# Patient Record
Sex: Male | Born: 1940 | Race: White | Hispanic: No | Marital: Married | Smoking: Former smoker
Health system: Southern US, Community
[De-identification: ages and names within clinical notes are randomized; demographics above are authoritative.]

## PROBLEM LIST (undated history)

## (undated) DIAGNOSIS — M1990 Unspecified osteoarthritis, unspecified site: Secondary | ICD-10-CM

## (undated) DIAGNOSIS — M810 Age-related osteoporosis without current pathological fracture: Secondary | ICD-10-CM

## (undated) DIAGNOSIS — C801 Malignant (primary) neoplasm, unspecified: Secondary | ICD-10-CM

## (undated) DIAGNOSIS — K219 Gastro-esophageal reflux disease without esophagitis: Secondary | ICD-10-CM

## (undated) DIAGNOSIS — S3210XA Unspecified fracture of sacrum, initial encounter for closed fracture: Secondary | ICD-10-CM

## (undated) HISTORY — DX: Age-related osteoporosis without current pathological fracture: M81.0

## (undated) HISTORY — PX: COLONOSCOPY: SHX174

## (undated) HISTORY — PX: NECK SURGERY: SHX720

---

## 1995-07-14 HISTORY — PX: BACK SURGERY: SHX140

## 2012-04-06 ENCOUNTER — Ambulatory Visit: Payer: Self-pay | Admitting: Family Medicine

## 2012-04-06 LAB — CREATININE, SERUM: Creatinine: 0.77 mg/dL (ref 0.60–1.30)

## 2012-07-19 ENCOUNTER — Ambulatory Visit: Payer: Self-pay | Admitting: Rheumatology

## 2013-09-04 DIAGNOSIS — H9190 Unspecified hearing loss, unspecified ear: Secondary | ICD-10-CM | POA: Insufficient documentation

## 2013-12-25 DIAGNOSIS — J8410 Pulmonary fibrosis, unspecified: Secondary | ICD-10-CM | POA: Insufficient documentation

## 2013-12-25 DIAGNOSIS — M5136 Other intervertebral disc degeneration, lumbar region: Secondary | ICD-10-CM | POA: Insufficient documentation

## 2016-08-09 ENCOUNTER — Encounter: Payer: Self-pay | Admitting: Emergency Medicine

## 2016-08-09 ENCOUNTER — Emergency Department
Admission: EM | Admit: 2016-08-09 | Discharge: 2016-08-09 | Disposition: A | Discharge status: Home / Self Care | Payer: Medicare Other | Attending: Emergency Medicine | Admitting: Emergency Medicine

## 2016-08-09 DIAGNOSIS — M069 Rheumatoid arthritis, unspecified: Secondary | ICD-10-CM | POA: Insufficient documentation

## 2016-08-09 DIAGNOSIS — M25512 Pain in left shoulder: Secondary | ICD-10-CM | POA: Diagnosis present

## 2016-08-09 DIAGNOSIS — Z79899 Other long term (current) drug therapy: Secondary | ICD-10-CM | POA: Diagnosis not present

## 2016-08-09 DIAGNOSIS — Z87891 Personal history of nicotine dependence: Secondary | ICD-10-CM | POA: Diagnosis not present

## 2016-08-09 DIAGNOSIS — M05712 Rheumatoid arthritis with rheumatoid factor of left shoulder without organ or systems involvement: Secondary | ICD-10-CM

## 2016-08-09 HISTORY — DX: Unspecified osteoarthritis, unspecified site: M19.90

## 2016-08-09 MED ORDER — PREDNISONE 10 MG (21) PO TBPK: 10.0000 mg | ORAL_TABLET | Freq: Every day | ORAL | 0 refills | Status: DC

## 2016-08-09 MED ORDER — METHYLPREDNISOLONE SODIUM SUCC 125 MG IJ SOLR
125.0000 mg | Freq: Once | INTRAMUSCULAR | Status: AC
  Administered 2016-08-09: 125 mg via INTRAMUSCULAR
  Filled 2016-08-09: qty 2

## 2016-08-09 NOTE — ED Notes (Signed)
AAOx3.  Skin warm and dry.  NAD 

## 2016-08-09 NOTE — ED Notes (Signed)
First nurse note   States he is having a RA flare  Having increased pain to left shoulder and arm

## 2016-08-09 NOTE — ED Provider Notes (Signed)
[facility] Emergency Department Provider Note  ____________________________________________  Time seen: Approximately 1:05 PM  I have reviewed the triage vital signs and the nursing notes.   HISTORY  Chief Complaint Shoulder Pain    HPI Dean Davidson is a 76 y.o. male presenting to the emergency department with 10 out of 10 left shoulder pain. Patient has a history of rheumatoid arthritis. Patient states that his symptoms feel similar to his last "RA flare up". Patient states that rheumatoid arthritis flareups are typically treated with an injection of Solu-Medrol by his rheumatologist. Patient states that he is currently unable to seek care with his rheumatology provider. Patient denies fever, history of septic joints,  falls or mechanisms of trauma. Patient is left-handed.   Past Medical History:  Diagnosis Date  . Arthritis     There are no active problems to display for this patient.   Past Surgical History:  Procedure Laterality Date  . BACK SURGERY      Prior to Admission medications   Medication Sig Start Date End Date Taking? Authorizing Provider  methotrexate (RHEUMATREX) 2.5 MG tablet Take 2.5 mg by mouth once a week. Caution:Chemotherapy. Protect from light.   Yes Historical Provider, MD  oxyCODONE (ROXICODONE) 5 MG/5ML solution Take by mouth every 4 (four) hours as needed for severe pain.   Yes Historical Provider, MD  predniSONE (STERAPRED UNI-PAK 21 TAB) 10 MG (21) TBPK tablet Take 1 tablet (10 mg total) by mouth daily. Take 6 tablets the first day, take 5 tablets the second day, take 4 tablets the third day, take 3 tablets the fourth day, take 2 tablets the fifth day and 1 tablet the sixth day. 08/09/16   Lannie Fields, PA-C    Allergies Patient has no known allergies.  History reviewed. No pertinent family history.  Social History Social History  Substance Use Topics  . Smoking status: Former Smoker    Types: Cigarettes   Quit date: 07/13/2012  . Smokeless tobacco: Never Used  . Alcohol use Not on file     Review of Systems  Constitutional: No fever/chills Eyes: No visual changes. No discharge ENT: No upper respiratory complaints. Cardiovascular: no chest pain. Respiratory: no cough. No SOB. Gastrointestinal: No abdominal pain.  No nausea, no vomiting.  No diarrhea.  No constipation. Musculoskeletal: Patient has left shoulder pain.  Skin: Negative for rash, abrasions, lacerations, ecchymosis. Neurological: Negative for headaches, focal weakness or numbness. ____________________________________________   PHYSICAL EXAM:  VITAL SIGNS: ED Triage Vitals  Enc Vitals Group     BP --      Pulse Rate 08/09/16 1036 (!) 103     Resp 08/09/16 1036 20     Temp 08/09/16 1036 98.6 F (37 C)     Temp Source 08/09/16 1036 Oral     SpO2 08/09/16 1036 94 %     Weight 08/09/16 1037 186 lb (84.4 kg)     Height 08/09/16 1037 '5\' 11"'$  (1.803 m)     Head Circumference --      Peak Flow --      Pain Score 08/09/16 1037 10     Pain Loc --      Pain Edu? --      Excl. in [place]? --     Constitutional: Alert and oriented. Patient is talkative and engaged.  Eyes: Palpebral and bulbar conjunctiva are nonerythematous bilaterally. PERRL. EOMI. No scleral icterus bilaterally. Head: Atraumatic. ENT:      Ears: Tympanic membranes are pearly  bilaterally without effusion, erythema or purulent exudate. Bony landmarks are visualized bilaterally.       Nose: Skin overlying nares is without erythema. Nasal turbinates are non-erythematous. Nasal septum is midline.      Mouth/Throat: Mucous membranes are moist. Posterior pharynx is nonerythematous. No tonsillar exudate, hypertrophy or petechiae visualized. Uvula is midline. Neck: Full range of motion. No pain with neck flexion. Hematological/Lymphatic/Immunilogical: No cervical lymphadenopathy.  Cardiovascular: No pain with palpation over the anterior and posterior chest wall. Normal  rate, regular rhythm. Normal S1 and S2. No murmurs, gallops or rubs auscultated.  Respiratory: Trachea is midline. No retractions or presence of deformity. Thoracic expansion is symmetric with unaccentuated tactile fremitus. Resonant and symmetric percussion tones bilaterally. On auscultation, adventitious sounds are absent.  Musculoskeletal:  To inspection, patient is holding left upper extremity with right hand. Patient is able to perform adequate grip strength. Patient can perform limited flexion and extension at the left wrist. However, strength assessment was largely compromised due to patient's pain. Patient has no tenderness to palpation over the left acromioclavicular joint, left deltoid and left supraspinatus. Palpable radial and ulnar pulses bilaterally and symmetrically. Neurologic:  Normal for age. No gross focal neurologic deficits are appreciated.  Reflexes are 2+ and symmetric in the lower extremities bilaterally. Skin: Skin overlying left shoulder is not erythematous without evidence of bruising. Psychiatric: Mood and affect are normal for age. Speech and behavior are normal.  ____________________________________________   LABS (all labs ordered are listed, but only abnormal results are displayed)  Labs Reviewed - No data to display ____________________________________________  EKG   ____________________________________________  RADIOLOGY   No results found.  ____________________________________________    PROCEDURES  Procedure(s) performed:    Procedures    Medications  methylPREDNISolone sodium succinate (SOLU-MEDROL) 125 mg/2 mL injection 125 mg (125 mg Intramuscular Given 08/09/16 1316)     ____________________________________________   INITIAL IMPRESSION / ASSESSMENT AND PLAN / ED COURSE  Pertinent labs & imaging results that were available during my care of the patient were reviewed by me and considered in my medical decision making (see chart for  details).  Review of the [place] CSRS was performed in accordance of the [place] prior to dispensing any controlled drugs.    Assessment and Plan Rheumatoid Arthritis  Patient presents to the emergency department with exacerbation of rheumatoid arthritis. Patient recalls no falls or mechanisms of trauma that would warrant DG left shoulder. Patient was given Solu-Medrol in the emergency department and discharged with taper prednisone. Patient was advised to seek care with his rheumatology provider as soon as possible. All patient questions were answered.   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 Take 6 tablets the first day, take 5 tablets the second day, take 4 tablets the third day, take 3 tablets the fourth day, take 2 tablets the fifth day and 1 tablet the sixth day., Starting Sun 08/09/2016, Print            This chart was dictated using voice recognition software/Dragon. Despite best efforts to proofread, errors can occur which can change the meaning. Any change was purely unintentional.  Lannie Fields, PA-C 08/09/16 [place], MD 08/10/16 [phone]

## 2016-08-09 NOTE — ED Notes (Signed)
See triage note  Having increased pain to left shoulder and into left arm  Hx of RA and thinks meds are not working

## 2016-08-09 NOTE — ED Triage Notes (Signed)
Pt has RA; he has been feeling pain in his shoulder x 4 days, worsening last night. Pt has taken all his medications with no relief. Pt moaning and calling out during triage.

## 2016-09-10 DIAGNOSIS — S3210XA Unspecified fracture of sacrum, initial encounter for closed fracture: Secondary | ICD-10-CM

## 2016-09-10 HISTORY — DX: Unspecified fracture of sacrum, initial encounter for closed fracture: S32.10XA

## 2016-09-16 ENCOUNTER — Other Ambulatory Visit: Payer: Self-pay | Admitting: Rheumatology

## 2016-09-16 DIAGNOSIS — M545 Low back pain: Secondary | ICD-10-CM

## 2016-09-17 ENCOUNTER — Ambulatory Visit
Admission: RE | Admit: 2016-09-17 | Discharge: 2016-09-17 | Disposition: A | Discharge status: Home / Self Care | Payer: Medicare Other | Source: Ambulatory Visit | Attending: Rheumatology | Admitting: Rheumatology

## 2016-09-17 DIAGNOSIS — M48061 Spinal stenosis, lumbar region without neurogenic claudication: Secondary | ICD-10-CM | POA: Diagnosis not present

## 2016-09-17 DIAGNOSIS — M545 Low back pain: Secondary | ICD-10-CM

## 2016-09-17 DIAGNOSIS — Z9889 Other specified postprocedural states: Secondary | ICD-10-CM | POA: Diagnosis not present

## 2016-09-25 ENCOUNTER — Ambulatory Visit
Admission: RE | Admit: 2016-09-25 | Discharge: 2016-09-25 | Disposition: A | Discharge status: Home / Self Care | Payer: Medicare Other | Source: Ambulatory Visit | Attending: Family Medicine | Admitting: Family Medicine

## 2016-09-25 ENCOUNTER — Other Ambulatory Visit: Payer: Self-pay | Admitting: Family Medicine

## 2016-09-25 DIAGNOSIS — R042 Hemoptysis: Secondary | ICD-10-CM | POA: Insufficient documentation

## 2016-09-25 DIAGNOSIS — R918 Other nonspecific abnormal finding of lung field: Secondary | ICD-10-CM

## 2016-09-25 DIAGNOSIS — J8410 Pulmonary fibrosis, unspecified: Secondary | ICD-10-CM | POA: Diagnosis not present

## 2016-09-25 MED ORDER — IOPAMIDOL (ISOVUE-370) INJECTION 76%
100.0000 mL | Freq: Once | INTRAVENOUS | Status: AC | PRN
  Administered 2016-09-25: 75 mL via INTRAVENOUS

## 2016-09-27 ENCOUNTER — Emergency Department
Admission: EM | Admit: 2016-09-27 | Discharge: 2016-09-27 | Disposition: A | Discharge status: Home / Self Care | Payer: Medicare Other | Attending: Emergency Medicine | Admitting: Emergency Medicine

## 2016-09-27 ENCOUNTER — Encounter: Payer: Self-pay | Admitting: Emergency Medicine

## 2016-09-27 DIAGNOSIS — Z87891 Personal history of nicotine dependence: Secondary | ICD-10-CM | POA: Diagnosis not present

## 2016-09-27 DIAGNOSIS — R918 Other nonspecific abnormal finding of lung field: Secondary | ICD-10-CM | POA: Diagnosis not present

## 2016-09-27 DIAGNOSIS — R042 Hemoptysis: Secondary | ICD-10-CM | POA: Diagnosis present

## 2016-09-27 LAB — COMPREHENSIVE METABOLIC PANEL
ALT: 16 U/L — AB (ref 17–63)
AST: 21 U/L (ref 15–41)
Albumin: 4.1 g/dL (ref 3.5–5.0)
Alkaline Phosphatase: 142 U/L — ABNORMAL HIGH (ref 38–126)
Anion gap: 6 (ref 5–15)
BUN: 9 mg/dL (ref 6–20)
CHLORIDE: 97 mmol/L — AB (ref 101–111)
CO2: 30 mmol/L (ref 22–32)
CREATININE: 0.68 mg/dL (ref 0.61–1.24)
Calcium: 9.2 mg/dL (ref 8.9–10.3)
GFR calc Af Amer: >60 mL/min (ref >60)
GLUCOSE: 90 mg/dL (ref 65–99)
Potassium: 4.4 mmol/L (ref 3.5–5.1)
Sodium: 133 mmol/L — ABNORMAL LOW (ref 135–145)
Total Bilirubin: 0.8 mg/dL (ref 0.3–1.2)
Total Protein: 7.2 g/dL (ref 6.5–8.1)

## 2016-09-27 LAB — CBC
HCT: 39.1 % — ABNORMAL LOW (ref 40.0–52.0)
Hemoglobin: 13.3 g/dL (ref 13.0–18.0)
MCH: 34.1 pg — AB (ref 26.0–34.0)
MCHC: 33.9 g/dL (ref 32.0–36.0)
MCV: 100.5 fL — AB (ref 80.0–100.0)
PLATELETS: 335 10*3/uL (ref 150–440)
RBC: 3.89 MIL/uL — ABNORMAL LOW (ref 4.40–5.90)
RDW: 14.4 % (ref 11.5–14.5)
WBC: 10.9 10*3/uL — ABNORMAL HIGH (ref 3.8–10.6)

## 2016-09-27 NOTE — ED Notes (Signed)
BLUE AND RED TUBE IN LAB

## 2016-09-27 NOTE — ED Triage Notes (Addendum)
Pt states he has been coughing up blood clots off and on for the past week, pt states the clots are dark red. Pt seen by PCP for the same on Friday. Has left lung mass, but unsure if it malignant.  Pt states for the past 2 weeks he has had dysphagia as well.  PCP referred for bronch which has not happened. Pt seen at [facility]. Pt states he was recently on Fosamax which he states can cause bleeding esophagus. Denies any recent intubation. Denies any SOB. Pt's wife states his voice has become more hoarse over the past week as well. PT HAD CT SCAN W/ CONTRAST ON 3/16

## 2016-09-27 NOTE — ED Notes (Signed)

## 2016-09-27 NOTE — ED Provider Notes (Signed)
[facility] Emergency Department Provider Note  ____________________________________________   First MD Initiated Contact with Patient 09/27/16 1147     (approximate)  I have reviewed the triage vital signs and the nursing notes.   HISTORY  Chief Complaint Hemoptysis   HPI Dean Davidson is a 76 y.o. male with a recent diagnosis of a lung mass who is presenting with hemoptysis. He has had 3 episodes of hemoptysis each where he coughed up about a quarter or slightly larger sized clot. 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 The patient [email or web address] blood in several days but this morning coughed up another quarter size clot and presented to the emergency department because his primary care provider had told him to come to the hospital for any subsequent hemoptysis. He says that his voice is also slightly hoarse but denies any pain or fever at this time. There is primary care doctor he is in the process of scheduling bronchoscopy as well as oncology follow-up.   Past Medical History:  Diagnosis Date  . Arthritis     There are no active problems to display for this patient.   Past Surgical History:  Procedure Laterality Date  . BACK SURGERY    . NECK SURGERY      Prior to Admission medications   Medication Sig Start Date End Date Taking? Authorizing Provider  methotrexate (RHEUMATREX) 2.5 MG tablet Take 2.5 mg by mouth once a week. Caution:Chemotherapy. Protect from light.    Historical Provider, MD  oxyCODONE (ROXICODONE) 5 MG/5ML solution Take by mouth every 4 (four) hours as needed for severe pain.    Historical Provider, MD  predniSONE (STERAPRED UNI-PAK 21 TAB) 10 MG (21) TBPK tablet Take 1 tablet (10 mg total) by mouth daily. Take 6 tablets the first day, take 5 tablets the  second day, take 4 tablets the third day, take 3 tablets the fourth day, take 2 tablets the fifth day and 1 tablet the sixth day. 08/09/16   Lannie Fields, PA-C    Allergies Patient has no known allergies.  History reviewed. No pertinent family history.  Social History Social History  Substance Use Topics  . Smoking status: Former Smoker    Types: Cigarettes    Quit date: 07/13/2012  . Smokeless tobacco: Never Used  . Alcohol use Not on file    Review of Systems Constitutional: No fever/chills Eyes: No visual changes. ENT: No sore throat. Cardiovascular: Denies chest pain. Respiratory: Denies shortness of breath. Gastrointestinal: No abdominal pain.  No nausea, no vomiting.  No diarrhea.  No constipation. Genitourinary: Negative for dysuria. Musculoskeletal: Negative for back pain. Skin: Negative for rash. Neurological: Negative for headaches, focal weakness or numbness.  10-point ROS otherwise negative.  ____________________________________________   PHYSICAL EXAM:  VITAL SIGNS: ED Triage Vitals  Enc Vitals Group     BP 09/27/16 1029 (!) 155/86     Pulse Rate 09/27/16 1029 84     Resp 09/27/16 1029 18     Temp 09/27/16 1029 98.9 F (37.2 C)     Temp Source 09/27/16 1029 Oral     SpO2 09/27/16 1029 95 %     Weight 09/27/16 1030 180 lb (81.6 kg)     Height 09/27/16 1030 '5\' 10"'$  (1.778 m)  Head Circumference --      Peak Flow --      Pain Score 09/27/16 1040 2     Pain Loc --      Pain Edu? --      Excl. in [place]? --     Constitutional: Alert and oriented. Well appearing and in no acute distress. Eyes: Conjunctivae are normal. PERRL. EOMI. Head: Atraumatic. Nose: No congestion/rhinnorhea. Mouth/Throat: Mucous membranes are moist.  Oropharynx non-erythematous. Neck: No stridor.   Cardiovascular: Normal rate, regular rhythm. Grossly normal heart sounds.   Respiratory: Normal respiratory effort.  No retractions. Lungs CTAB. Gastrointestinal: Soft and  nontender. No distention.  Musculoskeletal: No lower extremity tenderness nor edema.  No joint effusions. Neurologic:  Normal speech and language. No gross focal neurologic deficits are appreciated.  Skin:  Skin is warm, dry and intact. No rash noted. Psychiatric: Mood and affect are normal. Speech and behavior are normal.  ____________________________________________   LABS (all labs ordered are listed, but only abnormal results are displayed)  Labs Reviewed  COMPREHENSIVE METABOLIC PANEL - Abnormal; Notable for the following:       Result Value   Sodium 133 (*)    Chloride 97 (*)    ALT 16 (*)    Alkaline Phosphatase 142 (*)    All other components within normal limits  CBC - Abnormal; Notable for the following:    WBC 10.9 (*)    RBC 3.89 (*)    HCT 39.1 (*)    MCV 100.5 (*)    MCH 34.1 (*)    All other components within normal limits   ____________________________________________  EKG   ____________________________________________  RADIOLOGY  CT CHEST W CONTRAST (Accession [id number]) (Order [id number])  Imaging  Date: 09/25/2016 Department: [facility] CT Released By: Hulen Luster Authorizing: Hortencia Pilar, MD  Exam Information   Status Exam Begun  Exam Ended   Final [99] 09/25/2016 12:01 PM 09/25/2016 12:14 PM  PACS Images   Show images for CT CHEST W CONTRAST  Study Result   CLINICAL DATA:  Pt with lung mass and coughing up blood.  EXAM: CT CHEST WITH CONTRAST  TECHNIQUE: Multidetector CT imaging of the chest was performed during intravenous contrast administration.  CONTRAST:  75 mL of Isovue 370 intravenous contrast.  COMPARISON:  Chest CT, 07/19/2012 and 04/06/2012.  FINDINGS: Cardiovascular: The heart is normal in size and configuration. There are dense three-vessel coronary artery calcifications. The great vessels are normal in caliber. Mild atherosclerotic calcifications are noted along the thoracic aorta and aortic  arch branch vessels.  Mediastinum/Nodes: No neck base or axillary masses or adenopathy. There are prominent mediastinal lymph nodes. A prevascular node near the AP window measures 1 cm short axis. A precarinal node measures 12 mm in short axis. No mediastinal or hilar masses. No enlarged hilar lymph nodes. The trachea is widely patent. The esophagus is unremarkable.  Lungs/Pleura: There is a spiculated left upper lobe mass, new since the prior chest CT, measuring 3.8 x 2.5 x 4.4 cm. This has a broad base against the anterior pleural margin.  There are no other lung masses.  There are no suspicious nodules.  There are heterogeneous areas of coarse interstitial thickening with associated bronchiectasis and architectural distortion. This has a lower lung predominance, particularly in the lower lobes and left upper lobe lingula. It is also seen adjacent to the left upper lobe mass. Much of this appears to be honeycombing. Findings are consistent with usual interstitial fibrosis, which  has significantly advanced when compared to the prior chest radiograph. There are underlying changes of mild centrilobular emphysema.  No evidence of pneumonia or pulmonary edema. No pleural effusion or pneumothorax.  Upper Abdomen: There is a low-density lesion in the medial segment of the left liver lobe measuring 1 cm, similar to the prior study consistent with a cyst. No other liver lesions on the included field of view. No adrenal masses. No acute findings in the visualized upper abdomen.  Musculoskeletal: No fracture or acute finding. No osteoblastic or osteolytic lesions.  IMPRESSION: 1. 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 No convincing metastatic disease in the chest. There are prominent mediastinal lymph nodes, which are stable when compared to the prior CTs, likely reactive. There is no convincing metastatic hilar or mediastinal  adenopathy. There are no other lung masses or nodules to suggest metastatic disease to the lungs. 2. 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 3. No pneumonia, pulmonary edema or acute abnormality.   Electronically Signed   By: Lajean Manes M.D.   On: 09/25/2016 12:29     ____________________________________________   PROCEDURES  Procedure(s) performed:   Procedures  Critical Care performed:   ____________________________________________   INITIAL IMPRESSION / ASSESSMENT AND PLAN / ED COURSE  Pertinent labs & imaging results that were available during my care of the patient were reviewed by me and considered in my medical decision making (see chart for details).  ----------------------------------------- 12:25 PM on 09/27/2016 -----------------------------------------  I discussed the case with Dr. Humphrey Rolls of pulmonology who agrees that the amount of hemoptysis is minimal and very reassuring that the patient has a normal hemoglobin. I discussed return precautions with the patient including any worsening hemoptysis and that he should return to the hospital immediately for any worsening or concerning symptoms. We discussed the CAT scan results including the mass in the pulmonary fibrosis. The patient says that he is a former smoker but has stopped as of several years ago. He'll be following up with his primary care doctor to urgently schedule these follow-up appointments including a bronchoscopy oncology follow-up. He'll be discharged home.      ____________________________________________   FINAL CLINICAL IMPRESSION(S) / ED DIAGNOSES  Hemoptysis. Lung mass.    NEW MEDICATIONS STARTED DURING THIS VISIT:  New Prescriptions   No medications on file     Note:  This document was prepared using Dragon voice recognition software and may include unintentional dictation errors.      Orbie Pyo, MD 09/27/16 1226

## 2016-09-28 ENCOUNTER — Other Ambulatory Visit: Payer: Self-pay | Admitting: Family Medicine

## 2016-09-28 DIAGNOSIS — R1319 Other dysphagia: Secondary | ICD-10-CM

## 2016-09-28 DIAGNOSIS — R1310 Dysphagia, unspecified: Secondary | ICD-10-CM

## 2016-10-01 DIAGNOSIS — C3492 Malignant neoplasm of unspecified part of left bronchus or lung: Secondary | ICD-10-CM | POA: Insufficient documentation

## 2016-10-01 NOTE — Progress Notes (Signed)
[place]  [phone] [phone]) [phone]  ID: Stephani Police OB: February 01, 1941  MR#: [id number]  QMG#:[id number]  Patient Care Team: Hortencia Pilar, MD as PCP - General (Family Medicine)  CHIEF COMPLAINT: Mass of the upper lobe of the left lung.  INTERVAL HISTORY: Patient is a 76 year old male who noticed difficulty swallowing, solids worse than liquids, approximately 2 and half weeks ago. Last week he started coughing up blood which prompted a chest x-ray as well as CT scan. He also reports a more raspy voice over the past several days. He otherwise feels well. He has no neurologic complaints. He has a fair appetite, but denies weight loss. He denies any chest pain or shortness of breath. He denies any nausea, vomiting, constipation, or diarrhea. He has no urinary complaints. Patient otherwise feels well and offers no further specific complaints.  REVIEW OF SYSTEMS:   Review of Systems  Constitutional: Negative.  Negative for fever, malaise/fatigue and weight loss.  HENT:       Difficulty swallowing  Respiratory: Positive for hemoptysis. Negative for cough and shortness of breath.   Cardiovascular: Negative.  Negative for chest pain and leg swelling.  Gastrointestinal: Negative.  Negative for abdominal pain, blood in stool and melena.  Genitourinary: Negative.   Musculoskeletal: Negative.   Neurological: Negative.  Negative for weakness.  Psychiatric/Behavioral: Negative.  The patient is not nervous/anxious.     As per HPI. Otherwise, a complete review of systems is negative.  PAST MEDICAL HISTORY: Past Medical History:  Diagnosis Date  . Arthritis   . Osteoporosis     PAST SURGICAL HISTORY: Past Surgical History:  Procedure Laterality Date  . BACK SURGERY    . NECK SURGERY      FAMILY HISTORY: History reviewed. No pertinent family history.  ADVANCED DIRECTIVES (Y/N):  N  HEALTH MAINTENANCE: Social History  Substance Use Topics  .  Smoking status: Former Smoker    Types: Cigarettes    Quit date: 07/13/2012  . Smokeless tobacco: Never Used  . Alcohol use Not on file     Colonoscopy:  PAP:  Bone density:  Lipid panel:  Allergies  Allergen Reactions  . Silver Rash    Current Outpatient Prescriptions  Medication Sig Dispense Refill  . amitriptyline (ELAVIL) 50 MG tablet Take 50 mg by mouth at bedtime.    . cyclobenzaprine (FLEXERIL) 10 MG tablet Take 10 mg by mouth 2 (two) times daily.    . folic acid (FOLVITE) 1 MG tablet Take 1 mg by mouth daily.    . furosemide (LASIX) 20 MG tablet Take 20 mg by mouth daily.    . hydroxychloroquine (PLAQUENIL) 200 MG tablet Take 400 mg by mouth daily.    . methotrexate 50 MG/2ML injection Inject 25 mg into the vein once a week.    Marland Kitchen oxyCODONE (ROXICODONE) 15 MG immediate release tablet Take 15 mg by mouth every 4 (four) hours as needed for pain.    . predniSONE (DELTASONE) 5 MG tablet Take 5 mg by mouth 2 (two) times daily with a meal.    . sulfaSALAzine (AZULFIDINE) 500 MG tablet Take 500 mg by mouth 2 (two) times daily.      No current facility-administered medications for this visit.     OBJECTIVE: Vitals:   10/02/16 1455  BP: (!) 157/85  Pulse: 86  Resp: 18  Temp: 97.4 F (36.3 C)     Body mass index is 25.78 kg/m.    ECOG FS:1 - Symptomatic but  completely ambulatory  General: Well-developed, well-nourished, no acute distress. Eyes: Pink conjunctiva, anicteric sclera. HEENT: Normocephalic, moist mucous membranes, clear oropharnyx. Lungs: Clear to auscultation bilaterally. Heart: Regular rate and rhythm. No rubs, murmurs, or gallops. Abdomen: Soft, nontender, nondistended. No organomegaly noted, normoactive bowel sounds. Musculoskeletal: No edema, cyanosis, or clubbing. Neuro: Alert, answering all questions appropriately. Cranial nerves grossly intact. Skin: No rashes or petechiae noted. Psych: Normal affect. Lymphatics: No cervical, calvicular, axillary or  inguinal LAD.   LAB RESULTS:  Lab Results  Component Value Date   NA 133 (L) 09/27/2016   K 4.4 09/27/2016   CL 97 (L) 09/27/2016   CO2 30 09/27/2016   GLUCOSE 90 09/27/2016   BUN 9 09/27/2016   CREATININE 0.68 09/27/2016   CALCIUM 9.2 09/27/2016   PROT 7.2 09/27/2016   ALBUMIN 4.1 09/27/2016   AST 21 09/27/2016   ALT 16 (L) 09/27/2016   ALKPHOS 142 (H) 09/27/2016   BILITOT 0.8 09/27/2016   GFRNONAA >60 09/27/2016   GFRAA >60 09/27/2016    Lab Results  Component Value Date   WBC 10.9 (H) 09/27/2016   HGB 13.3 09/27/2016   HCT 39.1 (L) 09/27/2016   MCV 100.5 (H) 09/27/2016   PLT 335 09/27/2016     STUDIES: Ct Chest W Contrast  Result Date: 09/25/2016 CLINICAL DATA:  Pt with lung mass and coughing up blood. EXAM: CT CHEST WITH CONTRAST TECHNIQUE: Multidetector CT imaging of the chest was performed during intravenous contrast administration. CONTRAST:  75 mL of Isovue 370 intravenous contrast. COMPARISON:  Chest CT, 07/19/2012 and 04/06/2012. FINDINGS: Cardiovascular: The heart is normal in size and configuration. There are dense three-vessel coronary artery calcifications. The great vessels are normal in caliber. Mild atherosclerotic calcifications are noted along the thoracic aorta and aortic arch branch vessels. Mediastinum/Nodes: No neck base or axillary masses or adenopathy. There are prominent mediastinal lymph nodes. A prevascular node near the AP window measures 1 cm short axis. A precarinal node measures 12 mm in short axis. No mediastinal or hilar masses. No enlarged hilar lymph nodes. The trachea is widely patent. The esophagus is unremarkable. Lungs/Pleura: There is a spiculated left upper lobe mass, new since the prior chest CT, measuring 3.8 x 2.5 x 4.4 cm. This has a broad base against the anterior pleural margin. There are no other lung masses.  There are no suspicious nodules. There are heterogeneous areas of coarse interstitial thickening with associated  bronchiectasis and architectural distortion. This has a lower lung predominance, particularly in the lower lobes and left upper lobe lingula. It is also seen adjacent to the left upper lobe mass. Much of this appears to be honeycombing. Findings are consistent with usual interstitial fibrosis, which has significantly advanced when compared to the prior chest radiograph. There are underlying changes of mild centrilobular emphysema. No evidence of pneumonia or pulmonary edema. No pleural effusion or pneumothorax. Upper Abdomen: There is a low-density lesion in the medial segment of the left liver lobe measuring 1 cm, similar to the prior study consistent with a cyst. No other liver lesions on the included field of view. No adrenal masses. No acute findings in the visualized upper abdomen. Musculoskeletal: No fracture or acute finding. No osteoblastic or osteolytic lesions. IMPRESSION: 1. 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 No convincing metastatic disease in the chest. There are prominent mediastinal lymph nodes, which are stable when compared to the prior CTs, likely reactive. There is no convincing metastatic hilar or  mediastinal adenopathy. There are no other lung masses or nodules to suggest metastatic disease to the lungs. 2. 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 3. No pneumonia, pulmonary edema or acute abnormality. Electronically Signed   By: Lajean Manes M.D.   On: 09/25/2016 12:29   Mr Lumbar Spine Wo Contrast  Result Date: 09/17/2016 CLINICAL DATA:  With lumbar back pain for 1 month with no known injury. Pain radiating to the right leg and foot. Initial encounter. EXAM: MRI LUMBAR SPINE WITHOUT CONTRAST TECHNIQUE: Multiplanar, multisequence MR imaging of the lumbar spine was performed. No intravenous contrast was administered. COMPARISON:  [facility] lumbar  radiographs 09/14/2016 report (no images available). FINDINGS: Segmentation: Lumbar segmentation appears to be normal and will be designated as such for this report. Alignment: Mild straightening of lumbar lordosis. Mild retrolisthesis at both L1-L2 and L5-S1. Vertebrae: Degenerative endplate marrow changes are noted in the lumbar spine especially at L4-L5 and L5-S1 where previous surgical changes are noted. There is very low level edema in the posterior L4 and L5 vertebrae which is felt to be degenerative in nature. However, there is severe marrow edema throughout much of the visible sacrum (series 6, image 36 and series 4, image 10). This appears to be left greater than right with confluent sacral ala involvement. Confluent central S2 and S3 involvement is partially visible. There is mild associated presacral stranding or trace presacral fluid. The SI joints do not appear eroded, in the visible medial iliac bones appear spared. Conus medullaris: Extends to the L1 level and appears normal. Paraspinal and other soft tissues: Negative visualized abdominal viscera. Postoperative changes to the lower lumbar paraspinal soft tissues described below. Disc levels: L1-L2: Severe disc space loss with circumferential disc osteophyte complex and broad-based posterior component. Mild facet and ligament flavum hypertrophy. Mild spinal stenosis. Moderate to severe left and mild right L1 foraminal stenosis. L2-L3: Circumferential disc bulge. Mild facet and ligament flavum hypertrophy. No significant stenosis. L3-L4: Posterior disc space loss. Circumferential disc osteophyte complex. Postoperative changes to the lamina with up to moderate residual facet hypertrophy greater on the left. Mild bilateral L3 foraminal stenosis. L4-L5: Severe disc space loss with bulky left eccentric circumferential disc osteophyte complex. Postoperative changes to the lamina. Moderate residual facet hypertrophy greater on the left. No spinal stenosis  but there is moderate to severe left lateral recess stenosis (descending left L5 nerve root level) and mild bilateral L4 foraminal stenosis. L5-S1: Severe disc space loss. Bulky circumferential disc osteophyte complex with broad-based posterior component. Moderate facet hypertrophy greater on the right. Trace right facet joint fluid. Mild spinal and bilateral L5 foraminal stenosis. IMPRESSION: 1. Severe edema in the visible sacrum. Although not completely visualized I favor this reflects left worse than right acute sacral insufficiency fractures. Pelvis CT or MRI should confirm if the diagnosis is in doubt. 2. Widespread lumbar spine degeneration with postoperative changes to the posterior elements L3-L4 through L5-S1. Moderate to severe residual left lateral recess stenosis at L4-L5. Mild spinal stenosis and moderate to severe left foraminal stenosis at L1-L2. Electronically Signed   By: Genevie Ann M.D.   On: 09/17/2016 16:13    ASSESSMENT: Mass of the upper lobe of the left lung.  PLAN:  1. Mass of the upper lobe of the left lung:  CT scan results reviewed independently and reported as above. I suspect patient's difficulty swallowing, patient changes, and hemoptysis are all related to underlying lung  mass this is highly suspicious for malignancy. Have ordered a PET scan as well as CT-guided biopsy for further evaluation. Patient will return to clinic in 1 week to discuss the results and treatment planning if necessary. 2. Hypertension: Patient's blood pressure is elevated today, monitor.  Approximately 45 minutes was spent in discussion of which greater than 50% was consultation.  Patient expressed understanding and was in agreement with this plan. He also understands that He can call clinic at any time with any questions, concerns, or complaints.   Cancer Staging No matching staging information was found for the patient.  Lloyd Huger, MD   10/02/2016 3:43 PM

## 2016-10-02 ENCOUNTER — Encounter: Payer: Self-pay | Admitting: Oncology

## 2016-10-02 ENCOUNTER — Inpatient Hospital Stay: Payer: Medicare Other | Attending: Oncology | Admitting: Oncology

## 2016-10-02 DIAGNOSIS — Z7952 Long term (current) use of systemic steroids: Secondary | ICD-10-CM | POA: Diagnosis not present

## 2016-10-02 DIAGNOSIS — R490 Dysphonia: Secondary | ICD-10-CM | POA: Diagnosis not present

## 2016-10-02 DIAGNOSIS — R1310 Dysphagia, unspecified: Secondary | ICD-10-CM

## 2016-10-02 DIAGNOSIS — Z87891 Personal history of nicotine dependence: Secondary | ICD-10-CM | POA: Diagnosis not present

## 2016-10-02 DIAGNOSIS — Z79899 Other long term (current) drug therapy: Secondary | ICD-10-CM

## 2016-10-02 DIAGNOSIS — M818 Other osteoporosis without current pathological fracture: Secondary | ICD-10-CM

## 2016-10-02 DIAGNOSIS — R590 Localized enlarged lymph nodes: Secondary | ICD-10-CM

## 2016-10-02 DIAGNOSIS — I10 Essential (primary) hypertension: Secondary | ICD-10-CM

## 2016-10-02 DIAGNOSIS — I2510 Atherosclerotic heart disease of native coronary artery without angina pectoris: Secondary | ICD-10-CM | POA: Diagnosis not present

## 2016-10-02 DIAGNOSIS — M48061 Spinal stenosis, lumbar region without neurogenic claudication: Secondary | ICD-10-CM | POA: Diagnosis not present

## 2016-10-02 DIAGNOSIS — R042 Hemoptysis: Secondary | ICD-10-CM

## 2016-10-02 DIAGNOSIS — M2578 Osteophyte, vertebrae: Secondary | ICD-10-CM | POA: Diagnosis not present

## 2016-10-02 DIAGNOSIS — M129 Arthropathy, unspecified: Secondary | ICD-10-CM | POA: Diagnosis not present

## 2016-10-02 DIAGNOSIS — R918 Other nonspecific abnormal finding of lung field: Secondary | ICD-10-CM

## 2016-10-02 NOTE — Progress Notes (Signed)
New evaluation for lung mass. Complains of hemoptysis every 2 days, lower back pain, unable to swallow solid foods.

## 2016-10-05 ENCOUNTER — Encounter: Payer: Self-pay | Admitting: Rheumatology

## 2016-10-06 ENCOUNTER — Telehealth: Payer: Self-pay | Admitting: *Deleted

## 2016-10-06 ENCOUNTER — Other Ambulatory Visit: Payer: Self-pay | Admitting: *Deleted

## 2016-10-06 DIAGNOSIS — R918 Other nonspecific abnormal finding of lung field: Secondary | ICD-10-CM

## 2016-10-06 NOTE — Telephone Encounter (Signed)
spoke with pt's spouse regarding f/u appt on Friday. Pt currently scheduled for PET scan on 3/28 then biopsy will be scheduled after those results are available. At this time, Dr. Grayland Ormond does not need to follow up with pt on Friday 3/30 and only needs to follow up after biopsy. Pt's spouse has been made aware of this and that appt with Dr. Grayland Ormond on 3/30 will be cancelled. Informed pt's spouse will callback to reschedule follow up once biopsy has been scheduled. Pt's spouse verbalized understanding.

## 2016-10-07 ENCOUNTER — Ambulatory Visit
Admission: RE | Admit: 2016-10-07 | Discharge: 2016-10-07 | Disposition: A | Discharge status: Home / Self Care | Payer: Medicare Other | Source: Ambulatory Visit | Attending: Oncology | Admitting: Oncology

## 2016-10-07 DIAGNOSIS — R918 Other nonspecific abnormal finding of lung field: Secondary | ICD-10-CM | POA: Diagnosis not present

## 2016-10-07 DIAGNOSIS — M8448XD Pathological fracture, other site, subsequent encounter for fracture with routine healing: Secondary | ICD-10-CM | POA: Diagnosis not present

## 2016-10-07 DIAGNOSIS — R933 Abnormal findings on diagnostic imaging of other parts of digestive tract: Secondary | ICD-10-CM | POA: Insufficient documentation

## 2016-10-07 DIAGNOSIS — I2510 Atherosclerotic heart disease of native coronary artery without angina pectoris: Secondary | ICD-10-CM | POA: Diagnosis not present

## 2016-10-07 LAB — GLUCOSE, CAPILLARY: GLUCOSE-CAPILLARY: 100 mg/dL — AB (ref 65–99)

## 2016-10-07 MED ORDER — FLUDEOXYGLUCOSE F - 18 (FDG) INJECTION
12.0000 | Freq: Once | INTRAVENOUS | Status: AC | PRN
  Administered 2016-10-07: 12.94 via INTRAVENOUS

## 2016-10-08 ENCOUNTER — Other Ambulatory Visit: Payer: Self-pay | Admitting: *Deleted

## 2016-10-08 ENCOUNTER — Encounter: Payer: Self-pay | Admitting: *Deleted

## 2016-10-08 DIAGNOSIS — R918 Other nonspecific abnormal finding of lung field: Secondary | ICD-10-CM

## 2016-10-08 NOTE — Progress Notes (Signed)
  Oncology Nurse Navigator Documentation  Navigator Location: [facility] (10/08/16 1500)   )Navigator Encounter Type: Telephone (10/08/16 1500)                             Interventions: Coordination of Care (10/08/16 1500)   Coordination of Care: Appts (10/08/16 1500)       Spoke with pt's wife to inform her of PET results and to inform her of the following appointments: appt with Dr. Kathyrn Sheriff at [facility] in [place], scheduled for Tues 4/3 at 11am; PFT's 4/3 at 0730; CT biopsy 4/4 arrive at 0930 for 1030 procedure time; follow up with Dr. Grayland Ormond in [place] on 4/6 at 1030. Pt's wife wrote all appointments down and verbalized understanding. No further questions at this time.            Time Spent with Patient: 45 (10/08/16 1500)

## 2016-10-09 ENCOUNTER — Ambulatory Visit: Payer: Medicare Other | Admitting: Oncology

## 2016-10-13 ENCOUNTER — Other Ambulatory Visit: Payer: Self-pay | Admitting: Physician Assistant

## 2016-10-14 ENCOUNTER — Ambulatory Visit
Admission: RE | Admit: 2016-10-14 | Discharge: 2016-10-14 | Disposition: A | Discharge status: Home / Self Care | Payer: Medicare Other | Source: Ambulatory Visit | Attending: Oncology | Admitting: Oncology

## 2016-10-14 ENCOUNTER — Ambulatory Visit (HOSPITAL_COMMUNITY): Payer: Medicare Other

## 2016-10-14 ENCOUNTER — Ambulatory Visit: Payer: Medicare Other

## 2016-10-14 ENCOUNTER — Ambulatory Visit
Admission: RE | Admit: 2016-10-14 | Discharge: 2016-10-14 | Disposition: A | Discharge status: Home / Self Care | Payer: Medicare Other | Source: Ambulatory Visit | Attending: Diagnostic Radiology | Admitting: Diagnostic Radiology

## 2016-10-14 DIAGNOSIS — Z9889 Other specified postprocedural states: Secondary | ICD-10-CM | POA: Insufficient documentation

## 2016-10-14 DIAGNOSIS — R918 Other nonspecific abnormal finding of lung field: Secondary | ICD-10-CM

## 2016-10-14 DIAGNOSIS — J479 Bronchiectasis, uncomplicated: Secondary | ICD-10-CM | POA: Diagnosis not present

## 2016-10-14 DIAGNOSIS — C3412 Malignant neoplasm of upper lobe, left bronchus or lung: Secondary | ICD-10-CM | POA: Insufficient documentation

## 2016-10-14 LAB — APTT: APTT: 29 s (ref 24–36)

## 2016-10-14 LAB — CBC WITH DIFFERENTIAL/PLATELET
BASOS ABS: 0.1 10*3/uL (ref 0–0.1)
BASOS PCT: 1 %
EOS ABS: 0.2 10*3/uL (ref 0–0.7)
Eosinophils Relative: 2 %
HCT: 38.6 % — ABNORMAL LOW (ref 40.0–52.0)
HEMOGLOBIN: 12.9 g/dL — AB (ref 13.0–18.0)
Lymphocytes Relative: 20 %
Lymphs Abs: 1.6 10*3/uL (ref 1.0–3.6)
MCH: 33.8 pg (ref 26.0–34.0)
MCHC: 33.5 g/dL (ref 32.0–36.0)
MCV: 101 fL — ABNORMAL HIGH (ref 80.0–100.0)
MONOS PCT: 14 %
Monocytes Absolute: 1.1 10*3/uL — ABNORMAL HIGH (ref 0.2–1.0)
NEUTROS PCT: 63 %
Neutro Abs: 4.9 10*3/uL (ref 1.4–6.5)
Platelets: 306 10*3/uL (ref 150–440)
RBC: 3.82 MIL/uL — ABNORMAL LOW (ref 4.40–5.90)
RDW: 13.7 % (ref 11.5–14.5)
WBC: 7.9 10*3/uL (ref 3.8–10.6)

## 2016-10-14 LAB — PROTIME-INR
INR: 0.98
Prothrombin Time: 13 seconds (ref 11.4–15.2)

## 2016-10-14 MED ORDER — HYDROCODONE-ACETAMINOPHEN 5-325 MG PO TABS: 1.0000 | ORAL_TABLET | ORAL | Status: DC | PRN

## 2016-10-14 MED ORDER — MIDAZOLAM HCL 2 MG/2ML IJ SOLN
INTRAMUSCULAR | Status: DC | PRN
  Administered 2016-10-14: 1 mg via INTRAVENOUS

## 2016-10-14 MED ORDER — FENTANYL CITRATE (PF) 100 MCG/2ML IJ SOLN
INTRAMUSCULAR | Status: DC | PRN
  Administered 2016-10-14: 25 ug via INTRAVENOUS

## 2016-10-14 MED ORDER — SODIUM CHLORIDE 0.9 % IV SOLN
INTRAVENOUS | Status: DC
  Administered 2016-10-14: 20 mL/h via INTRAVENOUS

## 2016-10-14 MED ORDER — ALBUTEROL SULFATE (2.5 MG/3ML) 0.083% IN NEBU
2.5000 mg | INHALATION_SOLUTION | Freq: Once | RESPIRATORY_TRACT | Status: AC
  Administered 2016-10-14: 2.5 mg via RESPIRATORY_TRACT
  Filled 2016-10-14: qty 3

## 2016-10-14 NOTE — Procedures (Signed)
  Pre-operative Diagnosis: Left lung mass        Post-operative Diagnosis: Left lung mass  Indications: Needs tissue diagnosis  Procedure: CT guided biopsy of left upper lobe mass    Findings: Needle directed into left lung mass with CT.  2 cores obtained.  No pneumothorax.  Complications: None     EBL: Minimal  Plan: Bedrest 2 hours.  CXR in one hour.

## 2016-10-14 NOTE — Discharge Instructions (Signed)
Needle Biopsy of the Lung, Care After °This sheet gives you information about how to care for yourself after your procedure. Your health care provider may also give you more specific instructions. If you have problems or questions, contact your health care provider. °What can I expect after the procedure? °After the procedure, it is common to have: °· Soreness, pain, and tenderness where a tissue sample was taken (biopsy site). °· A cough. °· A sore throat. ° °Follow these instructions at home: °Biopsy site care °· Follow instructions from your health care provider about when to remove the bandage that was placed on the biopsy site. °· Keep the bandage dry until it has been removed. °· Check your biopsy site every day for signs of infection. Check for: °? More redness, swelling, or pain. °? More fluid or blood. °? Warmth to the touch. °? Pus or a bad smell. °General instructions °· Rest as directed by your health care provider. Ask your health care provider what activities are safe for you. °· Do not take baths, swim, or use a hot tub until your health care provider approves. °· Take over-the-counter and prescription medicines only as told by your health care provider. °· If you have airplane travel scheduled, talk with your health care provider about when it is safe for you to travel by airplane. °· It is up to you to get the results of your procedure. Ask your health care provider, or the department that is doing the procedure, when your results will be ready. °· Keep all follow-up visits as told by your health care provider. This is important. °Contact a health care provider if: °· You have more redness, swelling, or pain around your biopsy site. °· You have more fluid or blood coming from your biopsy site. °· Your biopsy site feels warm to the touch. °· You have pus or a bad smell coming from your biopsy site. °· You have a fever. °· You have pain that does not get better with medicine. °Get help right away  if: °· You have problems breathing. °· You have chest pain. °· You cough up blood. °· You faint. °· You have a fast heart rate. °Summary °· After a needle biopsy of the lung, it is common to have a cough, a sore throat, or soreness, pain, and tenderness where a tissue sample was taken (biopsy site). °· You should check your biopsy area every day for signs of infection, including pus or a bad smell, warmth, more fluid or blood, or more redness, swelling, or pain. °· You should not take baths, swim, or use a hot tub until your health care provider approves. °· It is up to you to get the results of your procedure. Ask your health care provider, or the department that is doing the procedure, when your results will be ready. °This information is not intended to replace advice given to you by your health care provider. Make sure you discuss any questions you have with your health care provider. °Document Released: 04/26/2007 Document Revised: 05/20/2016 Document Reviewed: 05/20/2016 °[employer] Interactive Patient Education © 2017 [employer] ° ° °

## 2016-10-14 NOTE — H&P (Signed)
Chief Complaint: Patient was seen in consultation today for a left lung mass biopsy at the request of Finnegan,Timothy J  Referring Physician(s): Finnegan,Timothy J  Patient Status: [facility] - Out-pt  History of Present Illness: Dean Davidson is a 76 y.o. male with recent history of hemoptysis.  Imaging discovered a left lung mass. Patient is also complaining of difficulty swallowing. He is not able to eat very well and has recently been losing weight. PET/CT imaging demonstrated an epiglottis lesion along with the left lung mass.  Scheduled for evaluation and biopsy of this epiglottis process.  Patient presents today for CT-guided biopsy of the left lung mass. Long history of smoking and quit 3 years ago.  Most recent hemoptysis was just a few days ago.  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  PMH is significant for Rheumatoid arthritis.   Past Medical History:  Diagnosis Date  . Arthritis   . Osteoporosis     Past Surgical History:  Procedure Laterality Date  . BACK SURGERY    . NECK SURGERY      Allergies: Patient has no active allergies.  Medications: Prior to Admission medications   Medication Sig Start Date End Date Taking? Authorizing Provider  amitriptyline (ELAVIL) 50 MG tablet Take 50 mg by mouth at bedtime.   Yes Historical Provider, MD  cyclobenzaprine (FLEXERIL) 10 MG tablet Take 10 mg by mouth 2 (two) times daily.   Yes Historical Provider, MD  folic acid (FOLVITE) 1 MG tablet Take 1 mg by mouth daily.   Yes Historical Provider, MD  furosemide (LASIX) 20 MG tablet Take 20 mg by mouth daily.   Yes Historical Provider, MD  hydroxychloroquine (PLAQUENIL) 200 MG tablet Take 400 mg by mouth daily.   Yes Historical Provider, MD  methotrexate 50 MG/2ML injection Inject 25 mg into the vein once a week.   Yes Historical Provider, MD  oxyCODONE (ROXICODONE) 15 MG immediate release tablet Take 15 mg by mouth every  4 (four) hours as needed for pain.   Yes Historical Provider, MD  predniSONE (DELTASONE) 5 MG tablet Take 5 mg by mouth 2 (two) times daily with a meal.   Yes Historical Provider, MD  sulfaSALAzine (AZULFIDINE) 500 MG tablet Take 500 mg by mouth 2 (two) times daily.    Yes Historical Provider, MD     No family history on file.  Social History   Social History  . Marital status: Married    Spouse name: N/A  . Number of children: N/A  . Years of education: N/A   Social History Main Topics  . Smoking status: Former Smoker    Types: Cigarettes    Quit date: 07/13/2012  . Smokeless tobacco: Never Used  . Alcohol use Not on file  . Drug use: No  . Sexual activity: Not on file   Other Topics Concern  . Not on file   Social History Narrative  . No narrative on file     Review of Systems  HENT: Positive for trouble swallowing.   Respiratory: Positive for cough. Negative for shortness of breath.   Cardiovascular: Negative for chest pain.  Gastrointestinal: Negative.   Genitourinary: Negative.   Neurological: Negative.     Vital Signs: BP (!) 158/78   Pulse 80   SpO2 95%   Physical Exam  HENT:  Mouth/Throat: Oropharynx is clear and moist.  Cardiovascular: Normal rate, regular rhythm and normal heart sounds.  Pulmonary/Chest: Effort normal. He has wheezes. He has rales.  Abdominal: Soft. Bowel sounds are normal.    Mallampati Score:  MD Evaluation Airway: WNL Heart: WNL Abdomen: WNL Chest/ Lungs: Other (comments) Chest/ lungs comments: Bilateral wheezing ASA  Classification: 2 Mallampati/Airway Score: One  Imaging: Ct Chest W Contrast  Result Date: 09/25/2016 CLINICAL DATA:  Pt with lung mass and coughing up blood. EXAM: CT CHEST WITH CONTRAST TECHNIQUE: Multidetector CT imaging of the chest was performed during intravenous contrast administration. CONTRAST:  75 mL of Isovue 370 intravenous contrast. COMPARISON:  Chest CT, 07/19/2012 and 04/06/2012. FINDINGS:  Cardiovascular: The heart is normal in size and configuration. There are dense three-vessel coronary artery calcifications. The great vessels are normal in caliber. Mild atherosclerotic calcifications are noted along the thoracic aorta and aortic arch branch vessels. Mediastinum/Nodes: No neck base or axillary masses or adenopathy. There are prominent mediastinal lymph nodes. A prevascular node near the AP window measures 1 cm short axis. A precarinal node measures 12 mm in short axis. No mediastinal or hilar masses. No enlarged hilar lymph nodes. The trachea is widely patent. The esophagus is unremarkable. Lungs/Pleura: There is a spiculated left upper lobe mass, new since the prior chest CT, measuring 3.8 x 2.5 x 4.4 cm. This has a broad base against the anterior pleural margin. There are no other lung masses.  There are no suspicious nodules. There are heterogeneous areas of coarse interstitial thickening with associated bronchiectasis and architectural distortion. This has a lower lung predominance, particularly in the lower lobes and left upper lobe lingula. It is also seen adjacent to the left upper lobe mass. Much of this appears to be honeycombing. Findings are consistent with usual interstitial fibrosis, which has significantly advanced when compared to the prior chest radiograph. There are underlying changes of mild centrilobular emphysema. No evidence of pneumonia or pulmonary edema. No pleural effusion or pneumothorax. Upper Abdomen: There is a low-density lesion in the medial segment of the left liver lobe measuring 1 cm, similar to the prior study consistent with a cyst. No other liver lesions on the included field of view. No adrenal masses. No acute findings in the visualized upper abdomen. Musculoskeletal: No fracture or acute finding. No osteoblastic or osteolytic lesions. IMPRESSION: 1. 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 No convincing metastatic  disease in the chest. There are prominent mediastinal lymph nodes, which are stable when compared to the prior CTs, likely reactive. There is no convincing metastatic hilar or mediastinal adenopathy. There are no other lung masses or nodules to suggest metastatic disease to the lungs. 2. 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 3. No pneumonia, pulmonary edema or acute abnormality. Electronically Signed   By: Lajean Manes M.D.   On: 09/25/2016 12:29   Mr Lumbar Spine Wo Contrast  Result Date: 09/17/2016 CLINICAL DATA:  With lumbar back pain for 1 month with no known injury. Pain radiating to the right leg and foot. Initial encounter. EXAM: MRI LUMBAR SPINE WITHOUT CONTRAST TECHNIQUE: Multiplanar, multisequence MR imaging of the lumbar spine was performed. No intravenous contrast was administered. COMPARISON:  [facility] lumbar radiographs 09/14/2016 report (no images available). FINDINGS: Segmentation: Lumbar segmentation appears to be normal and will be designated as such for this report. Alignment: Mild straightening of lumbar lordosis. Mild retrolisthesis at both L1-L2 and L5-S1. Vertebrae: Degenerative endplate marrow changes are noted in the lumbar spine  especially at L4-L5 and L5-S1 where previous surgical changes are noted. There is very low level edema in the posterior L4 and L5 vertebrae which is felt to be degenerative in nature. However, there is severe marrow edema throughout much of the visible sacrum (series 6, image 36 and series 4, image 10). This appears to be left greater than right with confluent sacral ala involvement. Confluent central S2 and S3 involvement is partially visible. There is mild associated presacral stranding or trace presacral fluid. The SI joints do not appear eroded, in the visible medial iliac bones appear spared. Conus medullaris: Extends to the L1 level and appears normal.  Paraspinal and other soft tissues: Negative visualized abdominal viscera. Postoperative changes to the lower lumbar paraspinal soft tissues described below. Disc levels: L1-L2: Severe disc space loss with circumferential disc osteophyte complex and broad-based posterior component. Mild facet and ligament flavum hypertrophy. Mild spinal stenosis. Moderate to severe left and mild right L1 foraminal stenosis. L2-L3: Circumferential disc bulge. Mild facet and ligament flavum hypertrophy. No significant stenosis. L3-L4: Posterior disc space loss. Circumferential disc osteophyte complex. Postoperative changes to the lamina with up to moderate residual facet hypertrophy greater on the left. Mild bilateral L3 foraminal stenosis. L4-L5: Severe disc space loss with bulky left eccentric circumferential disc osteophyte complex. Postoperative changes to the lamina. Moderate residual facet hypertrophy greater on the left. No spinal stenosis but there is moderate to severe left lateral recess stenosis (descending left L5 nerve root level) and mild bilateral L4 foraminal stenosis. L5-S1: Severe disc space loss. Bulky circumferential disc osteophyte complex with broad-based posterior component. Moderate facet hypertrophy greater on the right. Trace right facet joint fluid. Mild spinal and bilateral L5 foraminal stenosis. IMPRESSION: 1. Severe edema in the visible sacrum. Although not completely visualized I favor this reflects left worse than right acute sacral insufficiency fractures. Pelvis CT or MRI should confirm if the diagnosis is in doubt. 2. Widespread lumbar spine degeneration with postoperative changes to the posterior elements L3-L4 through L5-S1. Moderate to severe residual left lateral recess stenosis at L4-L5. Mild spinal stenosis and moderate to severe left foraminal stenosis at L1-L2. Electronically Signed   By: Genevie Ann M.D.   On: 09/17/2016 16:13   Nm Pet Image Initial (pi) Skull Base To Thigh  Result Date:  10/07/2016 CLINICAL DATA:  Initial treatment strategy for left lung mass. EXAM: NUCLEAR MEDICINE PET SKULL BASE TO THIGH TECHNIQUE: 12.9 mCi F-18 FDG was injected intravenously. Full-ring PET imaging was performed from the skull base to thigh after the radiotracer. CT data was obtained and used for attenuation correction and anatomic localization. FASTING BLOOD GLUCOSE:  Value: 100 mg/dl COMPARISON:  CT on 09/25/16 FINDINGS: NECK No hypermetabolic lymph nodes in the neck. Hypermetabolic soft tissue density is seen involving the epiglottis and left aryepiglottic fold. This measures 1.6 x 2.3 cm in maximum diameter, with SUV max of 12.7. Primary hypopharyngeal/supraglottic carcinoma cannot be excluded. CHEST 3.8 cm hypermetabolic mass is seen in the anterior left upper lobe with signs of left anterior chest wall invasion. This mass has SUV max of 17.3, and is consistent with primary bronchogenic carcinoma. Hypermetabolic activity is seen in the left inferior pleural space, corresponding with mild diffuse pleural thickening. This is likely reactive in etiology given evidence of chronic bibasilar pulmonary interstitial fibrosis. No focal pleural mass or pleural effusion identified. An area of FDG uptake is also seen corresponding to ill-defined airspace opacity in the posterior right upper lobe which is new since previous study  and consistent with infectious or inflammatory etiology. No hypermetabolic lymphadenopathy within the thorax . Aortic and coronary artery atherosclerosis. ABDOMEN/PELVIS No abnormal hypermetabolic activity within the liver, pancreas, adrenal glands, or spleen. No hypermetabolic lymph nodes in the abdomen or pelvis. Aortic atherosclerosis noted. SKELETON No focal hypermetabolic activity to suggest skeletal metastasis. Hypermetabolic activity is seen within the sacral ala bilaterally, consistent with healing sacral insufficiency fractures. IMPRESSION: 3.8 cm hypermetabolic mass in anterior left  upper lobe, with left anterior chest wall invasion. This is consistent with primary bronchogenic carcinoma. No evidence of thoracic nodal or distant metastatic disease. 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 New posterior right upper lobe airspace opacity with FDG uptake, consistent with infectious or inflammatory etiology. Hypermetabolic soft tissue density involving the epiglottis and left aryepiglottic fold. Primary hypopharyngeal/supraglottic carcinoma cannot be excluded. Recommend ENT consultation for direct visualization. Healing bilateral sacral insufficiency fractures. Aortic and coronary artery atherosclerosis. Electronically Signed   By: Earle Gell M.D.   On: 10/07/2016 13:21    Labs:  CBC:  Recent Labs  09/27/16 1045 10/14/16 0939  WBC 10.9* 7.9  HGB 13.3 12.9*  HCT 39.1* 38.6*  PLT 335 306    COAGS:  Recent Labs  10/14/16 0939  INR 0.98  APTT 29    BMP:  Recent Labs  09/27/16 1045  NA 133*  K 4.4  CL 97*  CO2 30  GLUCOSE 90  BUN 9  CALCIUM 9.2  CREATININE 0.68  GFRNONAA >60  GFRAA >60    LIVER FUNCTION TESTS:  Recent Labs  09/27/16 1045  BILITOT 0.8  AST 21  ALT 16*  ALKPHOS 142*  PROT 7.2  ALBUMIN 4.1    TUMOR MARKERS: No results for input(s): AFPTM, CEA, CA199, CHROMGRNA in the last 8760 hours.  Assessment and Plan:  76 yo with multiple medical problems including hemoptysis and left lung mass.  Mass is highly suspicious for a lung cancer and probably separate from the lesion in his epiglottis.  Discussed CT guided left lung mass biopsy with patient and wife.  Explained the risks which include pneumothorax, hemoptysis and even death.  I am concerned about hemoptysis since he is already having spontaneous hemoptysis. Patient is aware of reasons for the biopsy and risks.  Informed consent obtained.  Plan for CT guided left lung mass biopsy with moderate  sedation.    Thank you for this interesting consult.  I greatly enjoyed meeting Dean Davidson and look forward to participating in their care.  A copy of this report was sent to the requesting provider on this date.  Electronically Signed: Carylon Perches 10/14/2016, 10:17 AM   I spent a total of  15 Minutes   in face to face in clinical consultation, greater than 50% of which was counseling/coordinating care for a CT guided lung mass biopsy.

## 2016-10-15 LAB — SURGICAL PATHOLOGY

## 2016-10-16 ENCOUNTER — Inpatient Hospital Stay: Payer: Medicare Other | Attending: Oncology | Admitting: Oncology

## 2016-10-16 ENCOUNTER — Encounter
Admission: RE | Admit: 2016-10-16 | Discharge: 2016-10-16 | Disposition: A | Discharge status: Home / Self Care | Payer: Medicare Other | Source: Ambulatory Visit | Attending: Otolaryngology | Admitting: Otolaryngology

## 2016-10-16 ENCOUNTER — Encounter: Payer: Self-pay | Admitting: *Deleted

## 2016-10-16 VITALS — BP 167/78 | HR 89 | Temp 97.2°F | Resp 18 | Wt 178.8 lb

## 2016-10-16 DIAGNOSIS — I2510 Atherosclerotic heart disease of native coronary artery without angina pectoris: Secondary | ICD-10-CM | POA: Insufficient documentation

## 2016-10-16 DIAGNOSIS — R1310 Dysphagia, unspecified: Secondary | ICD-10-CM | POA: Diagnosis not present

## 2016-10-16 DIAGNOSIS — J387 Other diseases of larynx: Secondary | ICD-10-CM | POA: Insufficient documentation

## 2016-10-16 DIAGNOSIS — Z7189 Other specified counseling: Secondary | ICD-10-CM

## 2016-10-16 DIAGNOSIS — C321 Malignant neoplasm of supraglottis: Secondary | ICD-10-CM | POA: Diagnosis not present

## 2016-10-16 DIAGNOSIS — M069 Rheumatoid arthritis, unspecified: Secondary | ICD-10-CM | POA: Diagnosis not present

## 2016-10-16 DIAGNOSIS — C3492 Malignant neoplasm of unspecified part of left bronchus or lung: Secondary | ICD-10-CM

## 2016-10-16 DIAGNOSIS — R590 Localized enlarged lymph nodes: Secondary | ICD-10-CM | POA: Diagnosis not present

## 2016-10-16 DIAGNOSIS — C3411 Malignant neoplasm of upper lobe, right bronchus or lung: Secondary | ICD-10-CM

## 2016-10-16 DIAGNOSIS — Z7952 Long term (current) use of systemic steroids: Secondary | ICD-10-CM | POA: Diagnosis not present

## 2016-10-16 DIAGNOSIS — Z87891 Personal history of nicotine dependence: Secondary | ICD-10-CM | POA: Diagnosis not present

## 2016-10-16 DIAGNOSIS — Z8781 Personal history of (healed) traumatic fracture: Secondary | ICD-10-CM | POA: Insufficient documentation

## 2016-10-16 DIAGNOSIS — Z79899 Other long term (current) drug therapy: Secondary | ICD-10-CM | POA: Diagnosis not present

## 2016-10-16 DIAGNOSIS — M818 Other osteoporosis without current pathological fracture: Secondary | ICD-10-CM | POA: Insufficient documentation

## 2016-10-16 DIAGNOSIS — I10 Essential (primary) hypertension: Secondary | ICD-10-CM | POA: Diagnosis not present

## 2016-10-16 DIAGNOSIS — K219 Gastro-esophageal reflux disease without esophagitis: Secondary | ICD-10-CM

## 2016-10-16 HISTORY — DX: Unspecified fracture of sacrum, initial encounter for closed fracture: S32.10XA

## 2016-10-16 HISTORY — DX: Malignant (primary) neoplasm, unspecified: C80.1

## 2016-10-16 HISTORY — DX: Gastro-esophageal reflux disease without esophagitis: K21.9

## 2016-10-16 NOTE — Progress Notes (Signed)
Patient is here with his wife. He has no complaints today.

## 2016-10-16 NOTE — Progress Notes (Signed)
  Oncology Nurse Navigator Documentation  Navigator Location: [facility] (10/16/16 1300)   )Navigator Encounter Type: Follow-up Appt (10/16/16 1300)                         Barriers/Navigation Needs: No barriers at this time;No Questions (10/16/16 1300)   Interventions: Referrals;Coordination of Care (10/16/16 1300) Referrals: Other (cardiothoracic surgery) (10/16/16 1300) Coordination of Care: Appts (10/16/16 1300)         met with patient during follow up visit with Dr. Grayland Ormond to discuss biopsy results and treatment planning. All questions were answered during visit. Referral to Dr. Genevive Bi for evaluation for lung resection will be placed and pt notified of appt.          Time Spent with Patient: 45 (10/16/16 1300)

## 2016-10-16 NOTE — Patient Instructions (Signed)
  Your procedure is scheduled on: 10-19-16  Report to Same Day Surgery 2nd floor medical mall [facility] Entrance-take elevator on left to 2nd floor.  Check in with surgery information desk.) @ 8:45 AM- PTS WIFE AWARE OF TIME   Remember: Instructions that are not followed completely may result in serious medical risk, up to and including death, or upon the discretion of your surgeon and anesthesiologist your surgery may need to be rescheduled.    _x___ 1. Do not eat food or drink liquids after midnight. No gum chewing or hard candies.     __x__ 2. No Alcohol for 24 hours before or after surgery.   __x__3. No Smoking for 24 prior to surgery.   ____  4. Bring all medications with you on the day of surgery if instructed.    __x__ 5. Notify your doctor if there is any change in your medical condition     (cold, fever, infections).     Do not wear jewelry, make-up, hairpins, clips or nail polish.  Do not wear lotions, powders, or perfumes. You may wear deodorant.  Do not shave 48 hours prior to surgery. Men may shave face and neck.  Do not bring valuables to the hospital.    [facility] is not responsible for any belongings or valuables.               Contacts, dentures or bridgework may not be worn into surgery.  Leave your suitcase in the car. After surgery it may be brought to your room.  For patients admitted to the hospital, discharge time is determined by your treatment team.   Patients discharged the day of surgery will not be allowed to drive home.  You will need someone to drive you home and stay with you the night of your procedure.    Please read over the following fact sheets that you were given:   [facility] Preparing for Surgery and or MRSA Information   _x___ Take anti-hypertensive (unless it includes a diuretic), cardiac, seizure, asthma, anti-reflux and psychiatric medicines. These include:  1. OXYCODONE  2.SULFASALAZINE  3.PREDNISONE  4.  5.  6.  ____Fleets  enema or Magnesium Citrate as directed.   ____ Use CHG Soap or sage wipes as directed on instruction sheet   ____ Use inhalers on the day of surgery and bring to hospital day of surgery  ____ Stop Metformin and Janumet 2 days prior to surgery.    ____ Take 1/2 of usual insulin dose the night before surgery and none on the morning surgery.   ____ Follow recommendations from Cardiologist, Pulmonologist or PCP regarding stopping Aspirin, Coumadin, Pllavix ,Eliquis, Effient, or Pradaxa, and Pletal.  X____Stop Anti-inflammatories such as Advil, Aleve, Ibuprofen, Motrin, Naproxen, Naprosyn, Goodies powders or aspirin products NOW-OK to take Tylenol/OXYCODONE   _x___ Stop supplements until after surgery-STOP FISH OIL NOW   ____ Bring C-Pap to the hospital.

## 2016-10-16 NOTE — Progress Notes (Signed)
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  INTERVAL HISTORY: Patient returns to clinic today for further evaluation, additional diagnostic planning, and discussion of his lung biopsy results. He denies any further hemoptysis. He continues to have difficulty swallowing, solids worse than liquid. His voice is unchanged. He otherwise feels well. He has no neurologic complaints. He has a fair appetite, but denies weight loss. He denies any chest pain, cough, or shortness of breath. He denies any nausea, vomiting, constipation, or diarrhea. He has no urinary complaints. Patient otherwise feels well and offers no further specific complaints.  REVIEW OF SYSTEMS:   Review of Systems  Constitutional: Negative.  Negative for fever, malaise/fatigue and weight loss.  HENT:       Difficulty swallowing, speech changes  Respiratory: Negative for cough, hemoptysis, shortness of breath and stridor.   Cardiovascular: Negative.  Negative for chest pain and leg swelling.  Gastrointestinal: Negative.  Negative for abdominal pain, blood in stool and melena.  Genitourinary: Negative.   Musculoskeletal: Negative.   Neurological: Negative.  Negative for weakness.  Psychiatric/Behavioral: Negative.  The patient is not nervous/anxious.     As per HPI. Otherwise, a complete review of systems is negative.  PAST MEDICAL HISTORY: Past Medical History:  Diagnosis Date  . Arthritis    RHEUMATOID  . Cancer ([place])    SQUAMOUS CELL CARCINOMA LEFT LUNG JUST DIAGNOSED 10-16-16 BY DR Grayland Ormond PER WIFE  . GERD (gastroesophageal reflux disease)    OCC  . Osteoporosis   . Sacral fracture ([place]) 09/2016    PAST  SURGICAL HISTORY: Past Surgical History:  Procedure Laterality Date  . BACK SURGERY  1997   LUMBAR  . COLONOSCOPY    . MICROLARYNGOSCOPY N/A 10/19/2016   Procedure: MICROLARYNGOSCOPY;  Surgeon: Margaretha Sheffield, MD;  Location: [facility] ORS;  Service: ENT;  Laterality: N/A;  . NECK SURGERY    . RIGID BRONCHOSCOPY N/A 10/19/2016   Procedure: RIGID BRONCHOSCOPY;  Surgeon: Margaretha Sheffield, MD;  Location: [facility] ORS;  Service: ENT;  Laterality: N/A;  . RIGID ESOPHAGOSCOPY N/A 10/19/2016   Procedure: RIGID ESOPHAGOSCOPY WITH BIOPSIES OF EPIGLOTTIS;  Surgeon: Margaretha Sheffield, MD;  Location: [facility] ORS;  Service: ENT;  Laterality: N/A;    FAMILY HISTORY: Family History  Problem Relation Age of Onset  . Epilepsy Mother   . Cancer Neg Hx     ADVANCED DIRECTIVES (Y/N):  N  HEALTH MAINTENANCE: Social History  Substance Use Topics  . Smoking status: Former Smoker    Packs/day: 1.00    Years: 50.00    Types: Cigarettes    Quit date: 07/13/2012  . Smokeless tobacco: Never Used  . Alcohol use No     Colonoscopy:  PAP:  Bone density:  Lipid panel:  No Known Allergies  Current Outpatient Prescriptions  Medication Sig Dispense Refill  . amitriptyline (ELAVIL) 50 MG tablet Take 50 mg by mouth at bedtime.    . calcitonin, salmon, (MIACALCIN/FORTICAL) 200 UNIT/ACT nasal spray 1 spray at bedtime. Left nostril at 2130 every evening    . cyclobenzaprine (FLEXERIL) 10 MG tablet Take 10 mg by mouth 2 (two) times daily.    . folic acid (FOLVITE) 1 MG tablet Take 1 mg by mouth daily.    Marland Kitchen  furosemide (LASIX) 20 MG tablet Take 20 mg by mouth daily.    . hydroxychloroquine (PLAQUENIL) 200 MG tablet Take 200 mg by mouth 2 (two) times daily. Morning & afternoon    . methotrexate 50 MG/2ML injection Inject 25 mg into the muscle every Saturday.     . Multiple Vitamin (MULTIVITAMIN WITH MINERALS) TABS tablet Take 1 tablet by mouth daily with breakfast.    . oxyCODONE (ROXICODONE) 15 MG immediate release tablet Take 15 mg by  mouth every 4 (four) hours.     . predniSONE (DELTASONE) 5 MG tablet Take 5 mg by mouth 2 (two) times daily with a meal.    . sulfaSALAzine (AZULFIDINE) 500 MG tablet Take 500 mg by mouth 2 (two) times daily.     . calcium carbonate (TUMS - DOSED IN MG ELEMENTAL CALCIUM) 500 MG chewable tablet Chew 1 tablet by mouth as needed for indigestion or heartburn.    . Calcium-Magnesium-Vitamin D (CALCIUM 1200+D3 PO) Take 1 tablet by mouth daily.    . Omega-3 Fatty Acids (FISH OIL PO) Take 1 tablet by mouth daily.     No current facility-administered medications for this visit.     OBJECTIVE: Vitals:   10/16/16 1046  BP: (!) 167/78  Pulse: 89  Resp: 18  Temp: 97.2 F (36.2 C)     Body mass index is 25.65 kg/m.    ECOG FS:1 - Symptomatic but completely ambulatory  General: Well-developed, well-nourished, no acute distress. Eyes: Pink conjunctiva, anicteric sclera. HEENT: Normocephalic, moist mucous membranes, clear oropharnyx. Lungs: Clear to auscultation bilaterally. Heart: Regular rate and rhythm. No rubs, murmurs, or gallops. Abdomen: Soft, nontender, nondistended. No organomegaly noted, normoactive bowel sounds. Musculoskeletal: No edema, cyanosis, or clubbing. Neuro: Alert, answering all questions appropriately. Cranial nerves grossly intact. Skin: No rashes or petechiae noted. Psych: Normal affect. Lymphatics: No cervical, calvicular, axillary or inguinal LAD.   LAB RESULTS:  Lab Results  Component Value Date   NA 133 (L) 09/27/2016   K 4.4 09/27/2016   CL 97 (L) 09/27/2016   CO2 30 09/27/2016   GLUCOSE 90 09/27/2016   BUN 9 09/27/2016   CREATININE 0.68 09/27/2016   CALCIUM 9.2 09/27/2016   PROT 7.2 09/27/2016   ALBUMIN 4.1 09/27/2016   AST 21 09/27/2016   ALT 16 (L) 09/27/2016   ALKPHOS 142 (H) 09/27/2016   BILITOT 0.8 09/27/2016   GFRNONAA >60 09/27/2016   GFRAA >60 09/27/2016    Lab Results  Component Value Date   WBC 7.9 10/14/2016   NEUTROABS 4.9 10/14/2016    HGB 12.9 (L) 10/14/2016   HCT 38.6 (L) 10/14/2016   MCV 101.0 (H) 10/14/2016   PLT 306 10/14/2016     STUDIES: Ct Chest W Contrast  Result Date: 09/25/2016 CLINICAL DATA:  Pt with lung mass and coughing up blood. EXAM: CT CHEST WITH CONTRAST TECHNIQUE: Multidetector CT imaging of the chest was performed during intravenous contrast administration. CONTRAST:  75 mL of Isovue 370 intravenous contrast. COMPARISON:  Chest CT, 07/19/2012 and 04/06/2012. FINDINGS: Cardiovascular: The heart is normal in size and configuration. There are dense three-vessel coronary artery calcifications. The great vessels are normal in caliber. Mild atherosclerotic calcifications are noted along the thoracic aorta and aortic arch branch vessels. Mediastinum/Nodes: No neck base or axillary masses or adenopathy. There are prominent mediastinal lymph nodes. A prevascular node near the AP window measures 1 cm short axis. A precarinal node measures 12 mm in short axis. No mediastinal or hilar masses. No enlarged  hilar lymph nodes. The trachea is widely patent. The esophagus is unremarkable. Lungs/Pleura: There is a spiculated left upper lobe mass, new since the prior chest CT, measuring 3.8 x 2.5 x 4.4 cm. This has a broad base against the anterior pleural margin. There are no other lung masses.  There are no suspicious nodules. There are heterogeneous areas of coarse interstitial thickening with associated bronchiectasis and architectural distortion. This has a lower lung predominance, particularly in the lower lobes and left upper lobe lingula. It is also seen adjacent to the left upper lobe mass. Much of this appears to be honeycombing. Findings are consistent with usual interstitial fibrosis, which has significantly advanced when compared to the prior chest radiograph. There are underlying changes of mild centrilobular emphysema. No evidence of pneumonia or pulmonary edema. No pleural effusion or pneumothorax. Upper Abdomen:  There is a low-density lesion in the medial segment of the left liver lobe measuring 1 cm, similar to the prior study consistent with a cyst. No other liver lesions on the included field of view. No adrenal masses. No acute findings in the visualized upper abdomen. Musculoskeletal: No fracture or acute finding. No osteoblastic or osteolytic lesions. IMPRESSION: 1. 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 No convincing metastatic disease in the chest. There are prominent mediastinal lymph nodes, which are stable when compared to the prior CTs, likely reactive. There is no convincing metastatic hilar or mediastinal adenopathy. There are no other lung masses or nodules to suggest metastatic disease to the lungs. 2. 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 3. No pneumonia, pulmonary edema or acute abnormality. Electronically Signed   By: Lajean Manes M.D.   On: 09/25/2016 12:29   Nm Pet Image Initial (pi) Skull Base To Thigh  Result Date: 10/07/2016 CLINICAL DATA:  Initial treatment strategy for left lung mass. EXAM: NUCLEAR MEDICINE PET SKULL BASE TO THIGH TECHNIQUE: 12.9 mCi F-18 FDG was injected intravenously. Full-ring PET imaging was performed from the skull base to thigh after the radiotracer. CT data was obtained and used for attenuation correction and anatomic localization. FASTING BLOOD GLUCOSE:  Value: 100 mg/dl COMPARISON:  CT on 09/25/16 FINDINGS: NECK No hypermetabolic lymph nodes in the neck. Hypermetabolic soft tissue density is seen involving the epiglottis and left aryepiglottic fold. This measures 1.6 x 2.3 cm in maximum diameter, with SUV max of 12.7. Primary hypopharyngeal/supraglottic carcinoma cannot be excluded. CHEST 3.8 cm hypermetabolic mass is seen in the anterior left upper lobe with signs of left anterior chest wall invasion. This mass has SUV max of 17.3,  and is consistent with primary bronchogenic carcinoma. Hypermetabolic activity is seen in the left inferior pleural space, corresponding with mild diffuse pleural thickening. This is likely reactive in etiology given evidence of chronic bibasilar pulmonary interstitial fibrosis. No focal pleural mass or pleural effusion identified. An area of FDG uptake is also seen corresponding to ill-defined airspace opacity in the posterior right upper lobe which is new since previous study and consistent with infectious or inflammatory etiology. No hypermetabolic lymphadenopathy within the thorax . Aortic and coronary artery atherosclerosis. ABDOMEN/PELVIS No abnormal hypermetabolic activity within the liver, pancreas, adrenal glands, or spleen. No hypermetabolic lymph nodes in the abdomen or pelvis. Aortic atherosclerosis noted. SKELETON No focal hypermetabolic activity to suggest skeletal metastasis. Hypermetabolic activity is seen within the sacral ala bilaterally, consistent with healing sacral insufficiency fractures. IMPRESSION: 3.8 cm hypermetabolic  mass in anterior left upper lobe, with left anterior chest wall invasion. This is consistent with primary bronchogenic carcinoma. No evidence of thoracic nodal or distant metastatic disease. 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 New posterior right upper lobe airspace opacity with FDG uptake, consistent with infectious or inflammatory etiology. Hypermetabolic soft tissue density involving the epiglottis and left aryepiglottic fold. Primary hypopharyngeal/supraglottic carcinoma cannot be excluded. Recommend ENT consultation for direct visualization. Healing bilateral sacral insufficiency fractures. Aortic and coronary artery atherosclerosis. Electronically Signed   By: Earle Gell M.D.   On: 10/07/2016 13:21   Ct Biopsy  Result Date: 10/14/2016 INDICATION: 76 year old with a left lung mass  and epiglottis lesion. Tissue diagnosis is needed. EXAM: CT-GUIDED BIOPSY OF LEFT LUNG MASS MEDICATIONS: None. ANESTHESIA/SEDATION: Moderate (conscious) sedation was employed during this procedure. A total of Versed 1.0 mg and Fentanyl 25 mcg was administered intravenously. Moderate Sedation Time: 25 minutes. The patient's level of consciousness and vital signs were monitored continuously by radiology nursing throughout the procedure under my direct supervision. FLUOROSCOPY TIME:  None COMPLICATIONS: None immediate. PROCEDURE: Informed written consent was obtained from the patient after a thorough discussion of the procedural risks, benefits and alternatives. All questions were addressed. Maximal Sterile Barrier Technique was utilized including caps, mask, sterile gowns, sterile gloves, sterile drape, hand hygiene and skin antiseptic. A timeout was performed prior to the initiation of the procedure. Patient was placed supine on the CT scanner. Images through the upper chest were obtained. The left upper lobe pleural-based lesion was identified. Left upper chest was shaved. Left upper chest was prepped and draped in a sterile fashion. Skin was anesthetized with 1% lidocaine. A 17 gauge coaxial needle was directed into the pleural-based lesion with CT guidance. Two core biopsies obtained with an 18 gauge core device. Specimens placed in formalin. 17 gauge needle was removed using a BioSentry tract sealant. Follow up CT images were obtained. Bandage placed over the puncture site. FINDINGS: Pleural-based mass in the left upper lobe measuring up to 3.5 cm. Needle position confirmed within the lesion. Two adequate core biopsies were obtained. No pneumothorax on the post biopsy images. IMPRESSION: Successful CT-guided core biopsy of the left upper lung mass. Electronically Signed   By: Markus Daft M.D.   On: 10/14/2016 14:02   Dg Chest Port 1 View  Result Date: 10/14/2016 CLINICAL DATA:  Left lung biopsy EXAM: PORTABLE  CHEST 1 VIEW COMPARISON:  CT chest 10/14/2016. FINDINGS: 1219 hourschronic interstitial changes again noted. Cardiopericardial silhouette is at upper limits of normal for size. Left upper lobe pulmonary mass evident. No pneumothorax in the left hemithorax. The visualized bony structures of the thorax are intact. Telemetry leads overlie the chest. IMPRESSION: No evidence for left pneumothorax after lung mass biopsy. Electronically Signed   By: Misty Stanley M.D.   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 PLAN:  1. 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 This is likely a separate primary than the squamous cell carcinoma of his epiglottis. Have sent a referral to thoracic surgery for further evaluation. If patient is not a surgical candidate, he will likely require radiation and XRT. Return to clinic in 2 weeks for further evaluation and treatment planning. 2. Clinical stage I squamous  cell carcinoma of the epiglottis: Biopsy performed after clinic visit confirming malignancy and likely a second primary. Patient's margins were positive, therefore he will likely need local treatment with XRT possibly with concurrent chemotherapy. Return to clinic in 2 weeks for treatment planning. 3. Hypertension: Patient's blood pressure is elevated today, monitor.  Approximately 30 minutes was spent in discussion of which greater than 50% was consultation.  Patient expressed understanding and was in agreement with this plan. He also understands that He can call clinic at any time with any questions, concerns, or complaints.   Cancer Staging Malignant neoplasm of epiglottis ([place]) Staging form: Larynx - Supraglottis, AJCC 8th Edition - Clinical stage from 10/22/2016: Stage I (cT1, cN0, cM0) - Signed  by Lloyd Huger, MD on 10/22/2016  Squamous cell carcinoma of left lung [facility]) Staging form: Lung, AJCC 8th Edition - Clinical stage from 10/16/2016: Stage IIA (cT2b, cN0, cM0) - Signed by Lloyd Huger, MD on 10/16/2016   Lloyd Huger, MD   10/22/2016 8:01 PM

## 2016-10-19 ENCOUNTER — Ambulatory Visit: Payer: Medicare Other | Admitting: Anesthesiology

## 2016-10-19 ENCOUNTER — Observation Stay
Admission: RE | Admit: 2016-10-19 | Discharge: 2016-10-20 | Disposition: A | Discharge status: Home / Self Care | Payer: Medicare Other | Source: Ambulatory Visit | Attending: Otolaryngology | Admitting: Otolaryngology

## 2016-10-19 ENCOUNTER — Encounter: Payer: Self-pay | Admitting: *Deleted

## 2016-10-19 ENCOUNTER — Encounter
Admission: RE | Disposition: A | Discharge status: Home / Self Care | Payer: Self-pay | Source: Ambulatory Visit | Attending: Otolaryngology

## 2016-10-19 DIAGNOSIS — C321 Malignant neoplasm of supraglottis: Secondary | ICD-10-CM | POA: Diagnosis not present

## 2016-10-19 DIAGNOSIS — I4510 Unspecified right bundle-branch block: Secondary | ICD-10-CM | POA: Diagnosis not present

## 2016-10-19 DIAGNOSIS — Z79899 Other long term (current) drug therapy: Secondary | ICD-10-CM | POA: Diagnosis not present

## 2016-10-19 DIAGNOSIS — R1310 Dysphagia, unspecified: Secondary | ICD-10-CM | POA: Diagnosis present

## 2016-10-19 DIAGNOSIS — Z79891 Long term (current) use of opiate analgesic: Secondary | ICD-10-CM | POA: Diagnosis not present

## 2016-10-19 DIAGNOSIS — Z7952 Long term (current) use of systemic steroids: Secondary | ICD-10-CM | POA: Diagnosis not present

## 2016-10-19 DIAGNOSIS — J387 Other diseases of larynx: Secondary | ICD-10-CM | POA: Diagnosis present

## 2016-10-19 DIAGNOSIS — E7800 Pure hypercholesterolemia, unspecified: Secondary | ICD-10-CM | POA: Insufficient documentation

## 2016-10-19 DIAGNOSIS — H9190 Unspecified hearing loss, unspecified ear: Secondary | ICD-10-CM | POA: Diagnosis not present

## 2016-10-19 DIAGNOSIS — Z87891 Personal history of nicotine dependence: Secondary | ICD-10-CM | POA: Insufficient documentation

## 2016-10-19 DIAGNOSIS — Z91048 Other nonmedicinal substance allergy status: Secondary | ICD-10-CM | POA: Insufficient documentation

## 2016-10-19 DIAGNOSIS — L309 Dermatitis, unspecified: Secondary | ICD-10-CM | POA: Insufficient documentation

## 2016-10-19 DIAGNOSIS — K219 Gastro-esophageal reflux disease without esophagitis: Secondary | ICD-10-CM | POA: Diagnosis not present

## 2016-10-19 DIAGNOSIS — M1990 Unspecified osteoarthritis, unspecified site: Secondary | ICD-10-CM | POA: Insufficient documentation

## 2016-10-19 DIAGNOSIS — Z9889 Other specified postprocedural states: Secondary | ICD-10-CM | POA: Insufficient documentation

## 2016-10-19 DIAGNOSIS — G8929 Other chronic pain: Secondary | ICD-10-CM | POA: Insufficient documentation

## 2016-10-19 DIAGNOSIS — M810 Age-related osteoporosis without current pathological fracture: Secondary | ICD-10-CM | POA: Diagnosis not present

## 2016-10-19 DIAGNOSIS — M069 Rheumatoid arthritis, unspecified: Secondary | ICD-10-CM | POA: Insufficient documentation

## 2016-10-19 DIAGNOSIS — M25569 Pain in unspecified knee: Secondary | ICD-10-CM | POA: Insufficient documentation

## 2016-10-19 HISTORY — PX: RIGID BRONCHOSCOPY: SHX5069

## 2016-10-19 HISTORY — PX: MICROLARYNGOSCOPY: SHX5208

## 2016-10-19 HISTORY — PX: RIGID ESOPHAGOSCOPY: SHX5226

## 2016-10-19 SURGERY — MICROLARYNGOSCOPY
Anesthesia: General | Wound class: Clean Contaminated

## 2016-10-19 MED ORDER — FUROSEMIDE 20 MG PO TABS
20.0000 mg | ORAL_TABLET | Freq: Every day | ORAL | Status: DC
  Administered 2016-10-19: 20 mg via ORAL
  Filled 2016-10-19: qty 1

## 2016-10-19 MED ORDER — FAMOTIDINE 20 MG PO TABS
20.0000 mg | ORAL_TABLET | Freq: Once | ORAL | Status: AC
  Administered 2016-10-19: 20 mg via ORAL

## 2016-10-19 MED ORDER — FOLIC ACID 1 MG PO TABS
1.0000 mg | ORAL_TABLET | Freq: Every day | ORAL | Status: DC
  Administered 2016-10-19: 1 mg via ORAL
  Filled 2016-10-19: qty 1

## 2016-10-19 MED ORDER — DEXAMETHASONE SODIUM PHOSPHATE 10 MG/ML IJ SOLN
INTRAMUSCULAR | Status: DC | PRN
  Administered 2016-10-19: 8 mg via INTRAVENOUS

## 2016-10-19 MED ORDER — LACTATED RINGERS IV SOLN
INTRAVENOUS | Status: DC
  Administered 2016-10-19: 09:00:00 via INTRAVENOUS

## 2016-10-19 MED ORDER — METHOTREXATE SODIUM CHEMO INJECTION 50 MG/2ML: 25.0000 mg | INTRAMUSCULAR | Status: DC

## 2016-10-19 MED ORDER — PHENYLEPHRINE HCL 10 MG/ML IJ SOLN
INTRAMUSCULAR | Status: AC
  Filled 2016-10-19: qty 1

## 2016-10-19 MED ORDER — ORAL CARE MOUTH RINSE: 15.0000 mL | Freq: Two times a day (BID) | OROMUCOSAL | Status: DC

## 2016-10-19 MED ORDER — SUGAMMADEX SODIUM 200 MG/2ML IV SOLN
INTRAVENOUS | Status: AC
Start: 2016-10-19 — End: 2016-10-19
  Filled 2016-10-19: qty 2

## 2016-10-19 MED ORDER — PREDNISONE 5 MG PO TABS
5.0000 mg | ORAL_TABLET | Freq: Two times a day (BID) | ORAL | Status: DC
  Administered 2016-10-20: 5 mg via ORAL
  Filled 2016-10-19 (×3): qty 1

## 2016-10-19 MED ORDER — FENTANYL CITRATE (PF) 100 MCG/2ML IJ SOLN: 25.0000 ug | INTRAMUSCULAR | Status: DC | PRN

## 2016-10-19 MED ORDER — ONDANSETRON HCL 4 MG/2ML IJ SOLN
INTRAMUSCULAR | Status: AC
  Filled 2016-10-19: qty 2

## 2016-10-19 MED ORDER — PHENYLEPHRINE HCL 10 % OP SOLN
OPHTHALMIC | Status: DC | PRN
  Administered 2016-10-19: 4 mL via TOPICAL

## 2016-10-19 MED ORDER — SULFASALAZINE 500 MG PO TABS
500.0000 mg | ORAL_TABLET | Freq: Two times a day (BID) | ORAL | Status: DC
  Administered 2016-10-19: 500 mg via ORAL
  Filled 2016-10-19 (×4): qty 1

## 2016-10-19 MED ORDER — ACETAMINOPHEN 325 MG PO TABS: 650.0000 mg | ORAL_TABLET | Freq: Four times a day (QID) | ORAL | Status: DC | PRN

## 2016-10-19 MED ORDER — LIDOCAINE HCL (PF) 2 % IJ SOLN
INTRAMUSCULAR | Status: AC
  Filled 2016-10-19: qty 2

## 2016-10-19 MED ORDER — CALCIUM CARBONATE-VITAMIN D 500-200 MG-UNIT PO TABS
1.0000 | ORAL_TABLET | Freq: Two times a day (BID) | ORAL | Status: DC
  Administered 2016-10-19: 19:00:00 1 via ORAL
  Filled 2016-10-19: qty 1

## 2016-10-19 MED ORDER — CHLORHEXIDINE GLUCONATE 0.12 % MT SOLN
15.0000 mL | Freq: Two times a day (BID) | OROMUCOSAL | Status: DC
  Administered 2016-10-19: 15 mL via OROMUCOSAL
  Filled 2016-10-19: qty 15

## 2016-10-19 MED ORDER — PROPOFOL 10 MG/ML IV BOLUS
INTRAVENOUS | Status: DC | PRN
  Administered 2016-10-19: 170 mg via INTRAVENOUS

## 2016-10-19 MED ORDER — MIDAZOLAM HCL 2 MG/2ML IJ SOLN
INTRAMUSCULAR | Status: DC | PRN
  Administered 2016-10-19: 1 mg via INTRAVENOUS

## 2016-10-19 MED ORDER — ONDANSETRON HCL 4 MG/2ML IJ SOLN: 4.0000 mg | Freq: Four times a day (QID) | INTRAMUSCULAR | Status: DC | PRN

## 2016-10-19 MED ORDER — HYDROXYCHLOROQUINE SULFATE 200 MG PO TABS
200.0000 mg | ORAL_TABLET | Freq: Two times a day (BID) | ORAL | Status: DC
  Filled 2016-10-19: qty 1

## 2016-10-19 MED ORDER — CALCIUM CARBONATE ANTACID 500 MG PO CHEW: 1.0000 | CHEWABLE_TABLET | ORAL | Status: DC | PRN

## 2016-10-19 MED ORDER — DEXTROSE-NACL 5-0.45 % IV SOLN
INTRAVENOUS | Status: DC
  Administered 2016-10-19 – 2016-10-20 (×2): via INTRAVENOUS

## 2016-10-19 MED ORDER — SUCCINYLCHOLINE CHLORIDE 20 MG/ML IJ SOLN
INTRAMUSCULAR | Status: AC
  Filled 2016-10-19: qty 1

## 2016-10-19 MED ORDER — ONDANSETRON 4 MG PO TBDP: 4.0000 mg | ORAL_TABLET | Freq: Four times a day (QID) | ORAL | Status: DC | PRN

## 2016-10-19 MED ORDER — ROCURONIUM BROMIDE 50 MG/5ML IV SOLN
INTRAVENOUS | Status: AC
  Filled 2016-10-19: qty 1

## 2016-10-19 MED ORDER — OXYCODONE HCL 5 MG PO TABS
15.0000 mg | ORAL_TABLET | ORAL | Status: DC
  Administered 2016-10-19 – 2016-10-20 (×4): 15 mg via ORAL
  Filled 2016-10-19 (×4): qty 3

## 2016-10-19 MED ORDER — MIDAZOLAM HCL 2 MG/2ML IJ SOLN
INTRAMUSCULAR | Status: AC
  Filled 2016-10-19: qty 2

## 2016-10-19 MED ORDER — CYCLOBENZAPRINE HCL 10 MG PO TABS
10.0000 mg | ORAL_TABLET | Freq: Two times a day (BID) | ORAL | Status: DC
  Administered 2016-10-19: 10 mg via ORAL
  Filled 2016-10-19: qty 1

## 2016-10-19 MED ORDER — ONDANSETRON HCL 4 MG/2ML IJ SOLN
INTRAMUSCULAR | Status: DC | PRN
  Administered 2016-10-19: 4 mg via INTRAVENOUS

## 2016-10-19 MED ORDER — ACETAMINOPHEN 650 MG RE SUPP: 650.0000 mg | Freq: Four times a day (QID) | RECTAL | Status: DC | PRN

## 2016-10-19 MED ORDER — MORPHINE SULFATE (PF) 4 MG/ML IV SOLN: 3.0000 mg | INTRAVENOUS | Status: DC | PRN

## 2016-10-19 MED ORDER — SUGAMMADEX SODIUM 200 MG/2ML IV SOLN
INTRAVENOUS | Status: DC | PRN
  Administered 2016-10-19: 200 mg via INTRAVENOUS

## 2016-10-19 MED ORDER — AMITRIPTYLINE HCL 50 MG PO TABS
50.0000 mg | ORAL_TABLET | Freq: Every day | ORAL | Status: DC
  Administered 2016-10-19: 50 mg via ORAL
  Filled 2016-10-19: qty 1

## 2016-10-19 MED ORDER — ROCURONIUM BROMIDE 100 MG/10ML IV SOLN
INTRAVENOUS | Status: DC | PRN
  Administered 2016-10-19: 30 mg via INTRAVENOUS
  Administered 2016-10-19: 10 mg via INTRAVENOUS

## 2016-10-19 MED ORDER — DEXAMETHASONE SODIUM PHOSPHATE 10 MG/ML IJ SOLN
INTRAMUSCULAR | Status: AC
  Filled 2016-10-19: qty 1

## 2016-10-19 MED ORDER — FENTANYL CITRATE (PF) 100 MCG/2ML IJ SOLN
INTRAMUSCULAR | Status: AC
  Filled 2016-10-19: qty 2

## 2016-10-19 MED ORDER — MAGNESIUM HYDROXIDE 400 MG/5ML PO SUSP: 30.0000 mL | Freq: Every day | ORAL | Status: DC | PRN

## 2016-10-19 MED ORDER — PROPOFOL 10 MG/ML IV BOLUS
INTRAVENOUS | Status: AC
  Filled 2016-10-19: qty 20

## 2016-10-19 MED ORDER — LIDOCAINE HCL (CARDIAC) 20 MG/ML IV SOLN
INTRAVENOUS | Status: DC | PRN
  Administered 2016-10-19: 35 mg via INTRAVENOUS

## 2016-10-19 MED ORDER — LACTATED RINGERS IV SOLN
INTRAVENOUS | Status: DC | PRN
  Administered 2016-10-19: 11:00:00 via INTRAVENOUS

## 2016-10-19 MED ORDER — SODIUM CHLORIDE 0.9 % IJ SOLN
INTRAMUSCULAR | Status: AC
  Filled 2016-10-19: qty 20

## 2016-10-19 MED ORDER — ONDANSETRON HCL 4 MG/2ML IJ SOLN: 4.0000 mg | Freq: Once | INTRAMUSCULAR | Status: DC | PRN

## 2016-10-19 MED ORDER — FAMOTIDINE 20 MG PO TABS
ORAL_TABLET | ORAL | Status: AC
  Filled 2016-10-19: qty 1

## 2016-10-19 MED ORDER — BISACODYL 5 MG PO TBEC: 5.0000 mg | DELAYED_RELEASE_TABLET | Freq: Every day | ORAL | Status: DC | PRN

## 2016-10-19 MED ORDER — SUCCINYLCHOLINE CHLORIDE 20 MG/ML IJ SOLN
INTRAMUSCULAR | Status: DC | PRN
  Administered 2016-10-19: 100 mg via INTRAVENOUS

## 2016-10-19 MED ORDER — FENTANYL CITRATE (PF) 100 MCG/2ML IJ SOLN
INTRAMUSCULAR | Status: DC | PRN
  Administered 2016-10-19 (×2): 25 ug via INTRAVENOUS
  Administered 2016-10-19: 50 ug via INTRAVENOUS
  Administered 2016-10-19: 25 ug via INTRAVENOUS

## 2016-10-19 MED ORDER — FLEET ENEMA 7-19 GM/118ML RE ENEM: 1.0000 | ENEMA | Freq: Once | RECTAL | Status: DC | PRN

## 2016-10-19 MED ORDER — SEVOFLURANE IN SOLN
RESPIRATORY_TRACT | Status: AC
  Filled 2016-10-19: qty 250

## 2016-10-19 SURGICAL SUPPLY — 21 items
CANISTER SUCT 1200ML W/VALVE (MISCELLANEOUS) ×3 IMPLANT
CNTNR SPEC 2.5X3XGRAD LEK (MISCELLANEOUS) ×1
CONT SPEC 4OZ STER OR WHT (MISCELLANEOUS) ×2
CONTAINER SPEC 2.5X3XGRAD LEK (MISCELLANEOUS) ×1 IMPLANT
CUP MEDICINE 2OZ PLAST GRAD ST (MISCELLANEOUS) ×3 IMPLANT
DRAPE TABLE BACK 80X90 (DRAPES) ×3 IMPLANT
DRSG TELFA 3X8 NADH (GAUZE/BANDAGES/DRESSINGS) ×3 IMPLANT
ELECT EZSTD 165MM 6.5IN (MISCELLANEOUS) ×3
ELECTRODE EZSTD 165MM 6.5IN (MISCELLANEOUS) ×1 IMPLANT
GLOVE PROTEXIS LATEX SZ 7.5 (GLOVE) ×6 IMPLANT
GOWN STRL REUS W/ TWL LRG LVL3 (GOWN DISPOSABLE) ×2 IMPLANT
GOWN STRL REUS W/TWL LRG LVL3 (GOWN DISPOSABLE) ×4
KIT RM TURNOVER STRD PROC AR (KITS) ×3 IMPLANT
NS IRRIG 1000ML POUR BTL (IV SOLUTION) ×3 IMPLANT
PATTIES SURGICAL .5 X.5 (GAUZE/BANDAGES/DRESSINGS) ×3 IMPLANT
SOL ANTI-FOG 6CC FOG-OUT (MISCELLANEOUS) ×1 IMPLANT
SOL FOG-OUT ANTI-FOG 6CC (MISCELLANEOUS) ×2
SPONGE XRAY 4X4 16PLY STRL (MISCELLANEOUS) ×3 IMPLANT
TOWEL OR 17X26 4PK STRL BLUE (TOWEL DISPOSABLE) ×3 IMPLANT
TUBING CONNECTING 10 (TUBING) ×2 IMPLANT
TUBING CONNECTING 10' (TUBING) ×1

## 2016-10-19 NOTE — Anesthesia Procedure Notes (Signed)
Procedure Name: Intubation Date/Time: 10/19/2016 10:45 AM Performed by: Darlyne Russian Pre-anesthesia Checklist: Patient identified, Emergency Drugs available, Suction available, Patient being monitored and Timeout performed Patient Re-evaluated:Patient Re-evaluated prior to inductionOxygen Delivery Method: Circle system utilized Preoxygenation: Pre-oxygenation with 100% oxygen Intubation Type: IV induction Ventilation: Two handed mask ventilation required and Oral airway inserted - appropriate to patient size Laryngoscope Size: Mac and 4 Grade View: Grade II Tube type: Oral Tube size: 6.5 mm Number of attempts: 1 Airway Equipment and Method: Stylet Placement Confirmation: ETT inserted through vocal cords under direct vision,  positive ETCO2 and breath sounds checked- equal and bilateral Secured at: 21 cm Tube secured with: Tape Dental Injury: Teeth and Oropharynx as per pre-operative assessment

## 2016-10-19 NOTE — Progress Notes (Signed)
Margaretha Sheffield M.D. 10/19/2016 6:50 PM  Postop check  S: Patient is feeling pretty good and has not used any pain medication yet postoperatively that he knows of. He is breathing fine and was just evaluated by speech therapy.  O:AVSS. He is not coughing up any blood. He is breathing easily and using the incentive spirometry. There Is no swelling in his neck.Therapy evaluation shows that he can handle nectar thick liquids and pured foods. He needs to have his pills crushed and can mix them into pured foods.  A: He had removal of much of his epiglottis along with the growth on the epiglottis that is suspected to be cancer. We await path. Tolerating this very well so far  P: Will observe overnight and plan to discharge morning if he is doing well. We'll allow him to start on the limited diet. He is scheduled for a bronchoscopy and lung biopsy to be done on Wednesday. We will discuss the pathology and postop course with Dr. Maryjane Hurter.

## 2016-10-19 NOTE — Anesthesia Post-op Follow-up Note (Cosign Needed)
Anesthesia QCDR form completed.        

## 2016-10-19 NOTE — Anesthesia Preprocedure Evaluation (Signed)
Anesthesia Evaluation  Patient identified by MRN, date of birth, ID band Patient awake    Reviewed: Allergy & Precautions, NPO status , Patient's Chart, lab work & pertinent test results  History of Anesthesia Complications Negative for: history of anesthetic complications  Airway Mallampati: II       Dental  (+) Edentulous Lower, Edentulous Upper   Pulmonary neg pulmonary ROS, former smoker (e-cigarettes),           Cardiovascular negative cardio ROS       Neuro/Psych negative neurological ROS     GI/Hepatic Neg liver ROS, GERD  Medicated and Controlled,  Endo/Other  negative endocrine ROS  Renal/GU negative Renal ROS     Musculoskeletal   Abdominal   Peds  Hematology negative hematology ROS (+)   Anesthesia Other Findings   Reproductive/Obstetrics                             Anesthesia Physical Anesthesia Plan  ASA: II  Anesthesia Plan: General   Post-op Pain Management:    Induction: Intravenous  Airway Management Planned: Oral ETT  Additional Equipment:   Intra-op Plan:   Post-operative Plan:   Informed Consent: I have reviewed the patients History and Physical, chart, labs and discussed the procedure including the risks, benefits and alternatives for the proposed anesthesia with the patient or authorized representative who has indicated his/her understanding and acceptance.     Plan Discussed with:   Anesthesia Plan Comments:         Anesthesia Quick Evaluation

## 2016-10-19 NOTE — Op Note (Signed)
10/19/2016  12:07 PM    Dean Davidson  [id number]   Pre-Op Dx:  Lesion of the epiglottis with presumed carcinoma  Post-op Dx: Malignant neoplasm of the epiglottis  Proc:  excision of the epiglottis, esophagoscopy, bronchoscopy, direct microlaryngoscopy  Surg:  Dean Davidson  Anes:  GOT  EBL:  50 mL  Comp:  None  Findings:  A large ball approximately 2-1/2 cm to 3 cm in width involving the upper portion of the epiglottis. The laryngeal inlet was not involved. The mass extended down to the vallecula but did not involve the tongue base. The mass was extremely firm and could not be grasped and debris did so the entire top of the epiglottis was excised.  Procedure: The patient was given general anesthesia by oral endotracheal intubation. The mass was clearly evident on glide scope as the patient was intubated. The Jako laryngoscope was used for visualization of the hypopharynx and larynx. The mass was quite large and epiglottis of been pushed over towards the right side to visualize the larynx better. The vocal cords were clear did not show any sign of lesions. The piriform sinuses were clear. The arytenoids and lower area epiglottic fold were not involved. The entire upper portion the epiglottis was involved with tumor was quite firm. I tried to grasp it and pull on it but could not grasp it and a Sliding down infolding backwards.  A 10 x 14 mm x 50 cm solid scope was used to visualize the esophagus under direct vision. The esophagus was very healthy with normal mucosa and no sign of any lesions in the esophagus. A 6 mm bronchoscope was then used and placed under direct vision through the true cords above the endotracheal tube and into the distal trachea. Cuff was deflated and the length of the trachea was visualized. There were no lesions in the trachea or the upper mainstem bronchi on either side. The trachea was visualized way out again and the cuff was inflated below the cords to  secure the airway.  A mouth gag was used to try to open up the mouth and the tongue was pulled off to the left side. A Dean Davidson scope was used to help hold the tongue base. Looking into the vallecula to see the mass. Unable to debris some of this as it was so firm and even trying to grasp it with a Allis clamp was almost impossible. I was able to use a very long electrocautery and cut along the vallecula and trim it at the base of the tumor free up the area epiglottic folds and cut across this on the right side and then cut across it on the left side to eventually remove most of the entire mass in the upper portion of the epiglottis with electrocautery. Small piece that was left that was grasped and then cut across at the base the epiglottis. These were taken of this and you can see the laryngeal inlet with some of the epiglottis still left intact at the majority of the gross tumor is now removed. There is no significant bleeding. The patient was awakened and taken to the recovery room in satisfactory condition there were no operative complications  Dispo:   We will keep in observation for the day at least to make sure he is able to swallow some thickened liquids for discharge at home.  Plan:  Will have speech therapy do a bedside trial see what he can tolerate without aspirating. She is swallowing better can discharged  home. Dean Davidson try to get started on treatment soon after the pathology is evaluated in  Dean Davidson  10/19/2016 12:07 PM

## 2016-10-19 NOTE — Transfer of Care (Signed)
Immediate Anesthesia Transfer of Care Note  Patient: Dean Davidson  Procedure(s) Performed: Procedure(s): MICROLARYNGOSCOPY (N/A) RIGID BRONCHOSCOPY (N/A) RIGID ESOPHAGOSCOPY WITH BIOPSIES OF EPIGLOTTIS (N/A)  Patient Location: PACU  Anesthesia Type:General  Level of Consciousness: awake, alert , oriented and patient cooperative  Airway & Oxygen Therapy: Patient Spontanous Breathing and Patient connected to nasal cannula oxygen  Post-op Assessment: Report given to RN and Post -op Vital signs reviewed and stable  Post vital signs: Reviewed and stable  Last Vitals:  Vitals:   10/19/16 0846 10/19/16 1212  BP: 140/84 (!) 173/106  Pulse: 89 75  Resp: 18 15  Temp: 36.6 C 37 C    Last Pain:  Vitals:   10/19/16 1212  TempSrc: Temporal  PainSc: 0-No pain         Complications: No apparent anesthesia complications

## 2016-10-19 NOTE — Anesthesia Postprocedure Evaluation (Signed)
Anesthesia Post Note  Patient: TEVYN CODD  Procedure(s) Performed: Procedure(s) (LRB): MICROLARYNGOSCOPY (N/A) RIGID BRONCHOSCOPY (N/A) RIGID ESOPHAGOSCOPY WITH BIOPSIES OF EPIGLOTTIS (N/A)  Patient location during evaluation: PACU Anesthesia Type: General Level of consciousness: awake and alert Pain management: pain level controlled Vital Signs Assessment: post-procedure vital signs reviewed and stable Respiratory status: spontaneous breathing and respiratory function stable Cardiovascular status: stable Anesthetic complications: no     Last Vitals:  Vitals:   10/19/16 1245 10/19/16 1300  BP: (!) 154/91 (!) 159/87  Pulse: 72 77  Resp: 14 18  Temp:  36.6 C    Last Pain:  Vitals:   10/19/16 1300  TempSrc:   PainSc: 0-No pain                 KEPHART,WILLIAM K

## 2016-10-19 NOTE — H&P (Signed)
H&P has been reviewed the patient was reexamined,  and no changes necessary. To be downloaded later.

## 2016-10-19 NOTE — Evaluation (Signed)
Clinical/Bedside Swallow Evaluation Patient Details  Name: LEM Davidson MRN: [id number] Date of Birth: April 25, 1941  Today's Date: 10/19/2016 Time: SLP Start Time (ACUTE ONLY): 1700 SLP Stop Time (ACUTE ONLY): 1800 SLP Time Calculation (min) (ACUTE ONLY): 60 min  Past Medical History:  Past Medical History:  Diagnosis Date  . Arthritis    RHEUMATOID  . Cancer ([place])    SQUAMOUS CELL CARCINOMA LEFT LUNG JUST DIAGNOSED 10-16-16 BY DR Grayland Ormond PER WIFE  . GERD (gastroesophageal reflux disease)    OCC  . Osteoporosis   . Sacral fracture ([place]) 09/2016   Past Surgical History:  Past Surgical History:  Procedure Laterality Date  . BACK SURGERY  1997   LUMBAR  . COLONOSCOPY    . NECK SURGERY     HPI:  Pt  is a 76 y.o. male with recent history of hemoptysis.  Imaging discovered a left laryngeal mass along the epiglottis. Patient is also complaining of some difficulty swallowing. He is not able to eat very well and has recently been losing weight. PET/CT imaging demonstrated an epiglottis lesion along with the left larynx.  Patient tolerated surgery today w/  removal of much of his epiglottis along with the growth on the epiglottis that is suspected to be cancer. Long history of smoking and quit 3 years ago.  Most recent hemoptysis was just a few days ago.  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  PMH is significant for Rheumatoid arthritis. Pt is awake and verbal; gravely vocal quality. Noted frequent throat clearing - pt stated he felt "phlegm there" indicating his lower throat. Educated pt on trying to refrain from throat clearing.    Assessment / Plan / Recommendation Clinical Impression  Pt appears at increased risk for aspiration - pt w/ surgery today to remove part of the epiglottis d/t recently discovered laryngeal Ca.  SLP Visit Diagnosis: Dysphagia, pharyngeal phase (R13.13)    Aspiration Risk  Mild aspiration risk;Moderate  aspiration risk;Risk for inadequate nutrition/hydration    Diet Recommendation  Dysphagia level 1 w/ Nectar consistency liquids; aspiration precautions; tray setup at meals  Medication Administration: Crushed with puree    Other  Recommendations Recommended Consults:  (Dietician f/u) Oral Care Recommendations: Oral care BID;Staff/trained caregiver to provide oral care Other Recommendations: Order thickener from pharmacy;Prohibited food (jello, ice cream, thin soups);Remove water pitcher;Have oral suction available   Follow up Recommendations Outpatient SLP (TBD)      Frequency and Duration min 2x/week  1 week       Prognosis Prognosis for Safe Diet Advancement: Fair Barriers to Reach Goals: Severity of deficits      Swallow Study   General Date of Onset: 10/19/16 HPI: Pt  is a 76 y.o. male with recent history of hemoptysis.  Imaging discovered a left laryngeal mass along the epiglottis. Patient is also complaining of some difficulty swallowing. He is not able to eat very well and has recently been losing weight. PET/CT imaging demonstrated an epiglottis lesion along with the left larynx.  Patient tolerated surgery today w/  removal of much of his epiglottis along with the growth on the epiglottis that is suspected to be cancer. Long history of smoking and quit 3 years ago.  Most recent hemoptysis was just a few days ago.  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  PMH is significant for Rheumatoid arthritis. Pt is awake and verbal; gravely vocal  quality. Noted frequent throat clearing - pt stated he felt "phlegm there" indicating his lower throat. Educated pt on trying to refrain from throat clearing.  Type of Study: Bedside Swallow Evaluation Previous Swallow Assessment: none Diet Prior to this Study: Dysphagia 3 (soft);Thin liquids Temperature Spikes Noted: No Respiratory Status: Nasal cannula (2 liters) History of Recent  Intubation: No Behavior/Cognition: Alert;Cooperative;Pleasant mood;Requires cueing (min) Oral Cavity Assessment: Dry Oral Care Completed by SLP: Recent completion by staff Oral Cavity - Dentition: Edentulous Vision: Functional for self-feeding Self-Feeding Abilities: Able to feed self;Needs set up;Needs assist Patient Positioning: Upright in bed Baseline Vocal Quality: Breathy;Hoarse;Low vocal intensity (gravely) Volitional Cough: Strong Volitional Swallow: Able to elicit    Oral/Motor/Sensory Function Overall Oral Motor/Sensory Function: Within functional limits   Ice Chips Ice chips: Impaired Presentation: Spoon (fed; 3 trials) Oral Phase Impairments:  (none) Oral Phase Functional Implications:  (none) Pharyngeal Phase Impairments: Throat Clearing - Delayed (x2) Other Comments: pt also had a baseline of throat clearing prior to po's   Thin Liquid Thin Liquid: Impaired Presentation: Cup;Self Fed (4 trials) Oral Phase Impairments:  (none) Oral Phase Functional Implications:  (none) Pharyngeal  Phase Impairments: Cough - Immediate;Cough - Delayed (x4) Other Comments: more of a response than just throat clearing    Nectar Thick Nectar Thick Liquid: Impaired Presentation: Cup;Self Fed (~6 ozs total) Oral Phase Impairments:  (none) Oral phase functional implications:  (none) Pharyngeal Phase Impairments: Throat Clearing - Delayed (x2-3 but not consistent) Other Comments: did not increase w/ the presentation of trials   Honey Thick Honey Thick Liquid: Not tested   Puree Puree: Within functional limits Presentation: Self Fed;Spoon (4 ozs)   Solid   GO   Solid: Not tested    Functional Assessment Tool Used: clinical judgement Functional Limitations: Swallowing Swallow Current Status ([id number]): At least 40 percent but less than 60 percent impaired, limited or restricted Swallow Goal Status [phone]): At least 40 percent but less than 60 percent impaired, limited or  restricted Swallow Discharge Status [phone]): At least 20 percent but less than 40 percent impaired, limited or restricted    Dean Kenner, MS, CCC-SLP Dean Davidson 10/19/2016,8:03 PM

## 2016-10-20 ENCOUNTER — Encounter: Payer: Self-pay | Admitting: Otolaryngology

## 2016-10-20 ENCOUNTER — Encounter: Payer: Self-pay | Admitting: *Deleted

## 2016-10-20 DIAGNOSIS — C321 Malignant neoplasm of supraglottis: Secondary | ICD-10-CM | POA: Diagnosis not present

## 2016-10-20 LAB — SURGICAL PATHOLOGY

## 2016-10-20 NOTE — Care Management Obs Status (Signed)
[facility] [facility] NOTIFICATION   Patient Details  Name: MOHAMED PORTLOCK MRN: [id number] Date of Birth: [birth year]   Medicare Observation Status Notification Given:  No (admitted obs less than 24 hours)    Beverly Sessions, RN 10/20/2016, 9:48 AM

## 2016-10-20 NOTE — Progress Notes (Signed)
10/20/2016   9:00  Stephani Police to be D/C'd Home per MD order.  Discussed prescriptions and follow up appointments with the patient. Prescriptions given to patient, medication list explained in detail. Pt verbalized understanding.  Allergies as of 10/20/2016   No Known Allergies     Medication List    TAKE these medications   amitriptyline 50 MG tablet Commonly known as:  ELAVIL Take 50 mg by mouth at bedtime.   calcitonin (salmon) 200 UNIT/ACT nasal spray Commonly known as:  MIACALCIN/FORTICAL 1 spray at bedtime. Left nostril at 2130 every evening   CALCIUM 1200+D3 PO Take 1 tablet by mouth daily.   calcium carbonate 500 MG chewable tablet Commonly known as:  TUMS - dosed in mg elemental calcium Chew 1 tablet by mouth as needed for indigestion or heartburn.   cyclobenzaprine 10 MG tablet Commonly known as:  FLEXERIL Take 10 mg by mouth 2 (two) times daily.   FISH OIL PO Take 1 tablet by mouth daily.   folic acid 1 MG tablet Commonly known as:  FOLVITE Take 1 mg by mouth daily. Notes to patient:  Last administered 10/19/16 evening   furosemide 20 MG tablet Commonly known as:  LASIX Take 20 mg by mouth daily. Notes to patient:  Last administered 10/19/16 evening   hydroxychloroquine 200 MG tablet Commonly known as:  PLAQUENIL Take 200 mg by mouth 2 (two) times daily. Morning & afternoon   methotrexate 50 MG/2ML injection Inject 25 mg into the muscle every Saturday.   multivitamin with minerals Tabs tablet Take 1 tablet by mouth daily with breakfast.   oxyCODONE 15 MG immediate release tablet Commonly known as:  ROXICODONE Take 15 mg by mouth every 4 (four) hours. Notes to patient:  Last administered 10/20/16 ~8:00   predniSONE 5 MG tablet Commonly known as:  DELTASONE Take 5 mg by mouth 2 (two) times daily with a meal. Notes to patient:  Last administered 10/20/16 breakfast time   sulfaSALAzine 500 MG tablet Commonly known as:  AZULFIDINE Take 500 mg  by mouth 2 (two) times daily. Notes to patient:  Last administered night time 10/19/16       Vitals:   10/20/16 0021 10/20/16 0458  BP: 130/77 (!) 141/78  Pulse: 85 76  Resp: 18 18  Temp: 97.5 F (36.4 C) 97.6 F (36.4 C)    Skin clean, dry and intact without evidence of skin break down, no evidence of skin tears noted. IV catheter discontinued intact. Site without signs and symptoms of complications. Dressing and pressure applied. Pt denies pain at this time. No complaints noted.  An After Visit Summary was printed and given to the patient. Patient escorted via [place], and D/C home via private auto.  Dean Davidson

## 2016-10-20 NOTE — Progress Notes (Signed)
Speech Language Pathology Treatment: Dysphagia  Patient Details Name: Dean Davidson MRN: [id number] DOB: 04/11/1941 Today's Date: 10/20/2016 Time: 0800-0840 SLP Time Calculation (min) (ACUTE ONLY): 40 min  Assessment / Plan / Recommendation Clinical Impression  Pt seen this morning for f/u on toleration of dysphagia diet including Nectar consistency liquids that was initiated last evening post surgery(by ENT) yesterday morning - removal of a mass under/around the epiglottis. Pt appeared to adequately tolerate trials of Nectar liqudids via CUP, and Single ice chips via spoon, w/ no immediate, overt s/s of aspiration noted; a delayed, mild throat clearing was noted but did not increase in frequency during po trials. Pt stated he felt he did "pretty good" w/ his dinner meal last evening post evaluation following the aspiration precautions he was instructed on. NSG indicated pt was swallowing pills crushed in puree adequately as well. Pt was able to recall the recommended aspiration precautions; also followed such during po trials.  Discussed and gave education on aspiration precautions; monitoring of s/s of aspiration; dysphagia; sequelae of aspiration; food preparation and use of condiments; liquid consistency and preparation(Nectar). Recommend continue w/ currently ordered diet of Dysphagia level 1(puree) w/ Nectar liquids; f/u w/ ST services to assess/monitor appropriateness of diet consistency in the next 1-3 days as pt continues to heal from surgery. MD will refer pt for objective swallow assessment as needed(outpatient basis). Pt gave verbal agreement on tx session, recommendations. NSG updated.     HPI HPI: Pt  is a 76 y.o. male with recent history of hemoptysis.  Imaging discovered a left laryngeal mass along the epiglottis. Patient is also complaining of some difficulty swallowing. He is not able to eat very well and has recently been losing weight. PET/CT imaging demonstrated an epiglottis  lesion along with the left larynx.  Patient tolerated surgery today w/  removal of much of his epiglottis along with the growth on the epiglottis that is suspected to be cancer. Long history of smoking and quit 3 years ago.  Most recent hemoptysis was just a few days ago.  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  PMH is significant for Rheumatoid arthritis. Pt is awake and verbal; gravely vocal quality. Noted frequent throat clearing - pt stated he felt "phlegm there" indicating his lower throat. Educated pt on trying to refrain from throat clearing.       SLP Plan  Continue with current plan of care (pt to f/u w/ HH or outpt ST services per MD)       Recommendations  Diet recommendations: Dysphagia 1 (puree);Nectar-thick liquid (ice chips b/t meals post thorough oral care for PRN) Liquids provided via: Cup;No straw Medication Administration: Crushed with puree Supervision: Patient able to self feed Compensations: Minimize environmental distractions;Slow rate;Small sips/bites;Multiple dry swallows after each bite/sip;Follow solids with liquid Postural Changes and/or Swallow Maneuvers: Seated upright 90 degrees;Upright 30-60 min after meal                General recommendations:  (Dietician f/u as needed) Oral Care Recommendations: Oral care BID;Staff/trained caregiver to provide oral care Follow up Recommendations: Outpatient SLP;Home health SLP (TBD) SLP Visit Diagnosis: Dysphagia, pharyngeal phase (R13.13) Plan: Continue with current plan of care (pt to f/u w/ HH or outpt ST services per MD)       Colburn, Pierz, CCC-SLP [facility] 10/20/2016, 6:27 PM

## 2016-10-20 NOTE — Discharge Summary (Signed)
Physician Discharge Summary  Patient ID: Dean Davidson MRN: [id number] DOB/AGE: 1941/01/07 76 y.o.  Admit date: 10/19/2016 Discharge date: 10/20/2016  Admission Diagnoses:Epiglottic mass removed for biopsy. Direct laryngoscopy, esophagoscopy, and tracheobronchial evaluation  Discharge Diagnoses: Epiglottic mass with suspicion of cancer removed. 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 I had to remove the entire mass because it was so firm I could not get bites of it or debride it. The entire upper two thirds the epiglottis is removed. It did not involve the laryngeal inlet. He was evaluated by speech therapy yesterday and seems to tolerate nectar thickened liquids and pured foods well. He can have his pills crushed and put in applesauce. He is okay to be discharged home today.  Consults: Speech therapy  Significant Diagnostic Studies: None  Treatments: surgery: As noted above  Discharge Exam: Blood pressure (!) 141/78, pulse 76, temperature 97.6 F (36.4 C), temperature source Oral, resp. rate 18, height '5\' 10"'$  (1.778 m), weight 80.7 kg (178 lb), SpO2 96 %. Patient is breathing easily and feeling well. No cough. Lungs are clear. He is swallowing nectar thick liquids and pured foods well.  Disposition: 01-Home or Self Care  Discharge Instructions    Call MD for:  persistant nausea and vomiting    Complete by:  As directed    Call MD for:  redness, tenderness, or signs of infection (pain, swelling, redness, odor or green/yellow discharge around incision site)    Complete by:  As directed    Call MD for:  temperature >100.4    Complete by:  As directed    Discharge diet:    Complete by:  As directed    Thickened liquids to nectar, pureed foods. Crush all  pills and put in applesauce.   Increase activity slowly    Complete by:  As directed      Allergies as of 10/20/2016   No Known Allergies     Medication List    TAKE these medications   amitriptyline 50 MG tablet Commonly known as:  ELAVIL Take 50 mg by mouth at bedtime.   calcitonin (salmon) 200 UNIT/ACT nasal spray Commonly known as:  MIACALCIN/FORTICAL 1 spray at bedtime. Left nostril at 2130 every evening   CALCIUM 1200+D3 PO Take 1 tablet by mouth daily.   calcium carbonate 500 MG chewable tablet Commonly known as:  TUMS - dosed in mg elemental calcium Chew 1 tablet by mouth as needed for indigestion or heartburn.   cyclobenzaprine 10 MG tablet Commonly known as:  FLEXERIL Take 10 mg by mouth 2 (two) times daily.   FISH OIL PO Take 1 tablet by mouth daily.   folic acid 1 MG tablet Commonly known as:  FOLVITE Take 1 mg by mouth daily.   furosemide 20 MG tablet Commonly known as:  LASIX Take 20 mg by mouth daily.   hydroxychloroquine 200 MG tablet Commonly known as:  PLAQUENIL Take 200 mg by mouth 2 (two) times daily. Morning & afternoon   methotrexate 50 MG/2ML injection Inject 25 mg into the muscle every Saturday.   multivitamin with minerals Tabs tablet Take 1 tablet by mouth daily with breakfast.   oxyCODONE 15 MG immediate release tablet Commonly known as:  ROXICODONE Take 15 mg by mouth every 4 (four) hours.   predniSONE  5 MG tablet Commonly known as:  DELTASONE Take 5 mg by mouth 2 (two) times daily with a meal.   sulfaSALAzine 500 MG tablet Commonly known as:  AZULFIDINE Take 500 mg by mouth 2 (two) times daily.        Signed: Aydyn Testerman H 10/20/2016, 7:44 AM

## 2016-10-20 NOTE — Progress Notes (Signed)
  Oncology Nurse Navigator Documentation  Navigator Location: [facility] (10/20/16 0800)   )Navigator Encounter Type: Other (admission visit) (10/20/16 0800)                         Barriers/Navigation Needs: No barriers at this time;No Questions;No Needs (10/20/16 0800)   Interventions: Coordination of Care (10/20/16 0800)   Coordination of Care: Appts (10/20/16 0800)         met with patient during observation visit in hospital. Pt did not have any needs at this time. appt with Dr. Genevive Bi given to patient and reviewed all appts. Pt verbalized understanding. Informed pt to call with further questions/needs.          Time Spent with Patient: 15 (10/20/16 0800)

## 2016-10-21 ENCOUNTER — Encounter: Payer: Self-pay | Admitting: Cardiothoracic Surgery

## 2016-10-21 ENCOUNTER — Ambulatory Visit (INDEPENDENT_AMBULATORY_CARE_PROVIDER_SITE_OTHER): Payer: Medicare Other | Admitting: Cardiothoracic Surgery

## 2016-10-21 VITALS — BP 158/87 | HR 83 | Temp 98.0°F | Ht 66.0 in | Wt 173.0 lb

## 2016-10-21 DIAGNOSIS — C3412 Malignant neoplasm of upper lobe, left bronchus or lung: Secondary | ICD-10-CM

## 2016-10-21 NOTE — Patient Instructions (Signed)
Please give [place] a call if you have any questions or concerns. I will call Dr. Gary Fleet office to ask if we could schedule your appointment to an earlier date.

## 2016-10-21 NOTE — Progress Notes (Signed)
Patient ID: Dean Davidson, male   DOB: [birth year], 75 y.o.   MRN: [id number]  Chief Complaint  Patient presents with  . Other    Left Upper Lobe mass    Referred By Dr. Delight Hoh Reason for Referral left upper lobe mass  HPI Location, Quality, Duration, Severity, Timing, Context, Modifying Factors, Associated Signs and Symptoms.  Dean Davidson is a 76 y.o. male.  He was in his usual state of health until he developed hemoptysis. He was seen by his primary care physician who obtained a chest x-ray. The chest x-ray was abnormal and this led to a subsequent CT scan. The CT scan showed a left upper lobe mass with chest wall invasion. It measured about 4.5 cm. The patient also has a history of smoking but quit several years ago. He has a history of rheumatoid arthritis and is on a variety of immunosuppressive agents for management. He then underwent a PET scan which showed intense uptake in his epiglottis as well as in his left upper lobe mass. There is no evidence of mediastinal disease. 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 He states that he's been reasonably active up until the last several months when he experienced some problems with his sacrum and was felt to have a complication of his rheumatoid arthritis with involvement of his sacrum leading to significant pain. This seems not been as active as he had been previously. He does not get short of breath. However he is currently somewhat limited. He does state that he's had some difficulty with his voice over the last 2-3 weeks. His voice is been more "gravelly". 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   Past Medical History:  Diagnosis Date  . Arthritis    RHEUMATOID  . Cancer ([place])    SQUAMOUS CELL CARCINOMA LEFT LUNG JUST DIAGNOSED 10-16-16 BY DR Grayland Ormond PER WIFE  . GERD  (gastroesophageal reflux disease)    OCC  . Osteoporosis   . Sacral fracture ([place]) 09/2016    Past Surgical History:  Procedure Laterality Date  . BACK SURGERY  1997   LUMBAR  . COLONOSCOPY    . MICROLARYNGOSCOPY N/A 10/19/2016   Procedure: MICROLARYNGOSCOPY;  Surgeon: Margaretha Sheffield, MD;  Location: [facility] ORS;  Service: ENT;  Laterality: N/A;  . NECK SURGERY    . RIGID BRONCHOSCOPY N/A 10/19/2016   Procedure: RIGID BRONCHOSCOPY;  Surgeon: Margaretha Sheffield, MD;  Location: [facility] OR[facility];  Service: ENT;  Laterality: N/A;  . RIGID ESOPHAGOSCOPY N/A 10/19/2016   Procedure: RIGID ESOPHAGOSCOPY WITH BIOPSIES OF EPIGLOTTIS;  Surgeon: Margaretha Sheffield, MD;  Location: [facility] OR[facility];  Service: ENT;  Laterality: N/A;    Family History  Problem Relation Age of Onset  . Epilepsy Mother   . Cancer Neg Hx     Social History Social History  Substance Use Topics  . Smoking status: Former Smoker    Packs/day: 1.00    Years: 50.00    Types: Cigarettes    Quit date: 07/13/2012  . Smokeless tobacco: Never Used  . Alcohol use No    No Known Allergies  Current Outpatient Prescriptions  Medication Sig Dispense Refill  . amitriptyline (ELAVIL) 50 MG tablet Take 50 mg by mouth at bedtime.    . calcitonin, salmon, (MIACALCIN/FORTICAL) 200 UNIT/ACT nasal spray 1 spray at bedtime. Left nostril at 2130 every evening    .  calcium carbonate (TUMS - DOSED IN MG ELEMENTAL CALCIUM) 500 MG chewable tablet Chew 1 tablet by mouth as needed for indigestion or heartburn.    . Calcium-Magnesium-Vitamin D (CALCIUM 1200+D3 PO) Take 1 tablet by mouth daily.    . cyclobenzaprine (FLEXERIL) 10 MG tablet Take 10 mg by mouth 2 (two) times daily.    . folic acid (FOLVITE) 1 MG tablet Take 1 mg by mouth daily.    . furosemide (LASIX) 20 MG tablet Take 20 mg by mouth daily.    . hydroxychloroquine (PLAQUENIL) 200 MG tablet Take 200 mg by mouth 2 (two) times daily. Morning & afternoon    . methotrexate 50 MG/2ML injection Inject 25 mg into the  muscle every Saturday.     . Multiple Vitamin (MULTIVITAMIN WITH MINERALS) TABS tablet Take 1 tablet by mouth daily with breakfast.    . Omega-3 Fatty Acids (FISH OIL PO) Take 1 tablet by mouth daily.    Marland Kitchen oxyCODONE (ROXICODONE) 15 MG immediate release tablet Take 15 mg by mouth every 4 (four) hours.     . predniSONE (DELTASONE) 5 MG tablet Take 5 mg by mouth 2 (two) times daily with a meal.    . sulfaSALAzine (AZULFIDINE) 500 MG tablet Take 500 mg by mouth 2 (two) times daily.      No current facility-administered medications for this visit.       Review of Systems A complete review of systems was asked and was negative except for the following positive findingsWeight loss, changes in his voice, coughing up blood, joint pain, bruising.  Blood pressure (!) 158/87, pulse 83, temperature 98 F (36.7 C), temperature source Oral, height '5\' 6"'$  (1.676 m), weight 173 lb (78.5 kg), SpO2 95 %.  Physical Exam CONSTITUTIONAL:  Pleasant, well-developed, well-nourished, and in no acute distress. EYES: Pupils equal and reactive to light, Sclera non-icteric EARS, NOSE, MOUTH AND THROAT:  The oropharynx was clear.  Dentition is absent.  Oral mucosa pink and moist. LYMPH NODES:  Lymph nodes in the neck and axillae were normal RESPIRATORY:  Lungs were clear with scattered dry rales.  Normal respiratory effort without pathologic use of accessory muscles of respiration CARDIOVASCULAR: Heart was regular without murmurs.  There were no carotid bruits. GI: The abdomen was soft, nontender, and nondistended. There were no palpable masses. There was no hepatosplenomegaly. There were normal bowel sounds in all quadrants. GU:  Rectal deferred.   MUSCULOSKELETAL:  Normal muscle strength and tone.  No clubbing or cyanosis.  Stigmata of rheumatoid arthritis present   SKIN:  There were no pathologic skin lesions.  There were no nodules on palpation. NEUROLOGIC:  Sensation is normal.  Cranial nerves are grossly  intact. PSYCH:  Oriented to person, place and time.  Mood and affect are normal.  Data Reviewed CT scan and PET scan  I have personally reviewed the patient's imaging, laboratory findings and medical records.    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 I believe that these are 2 separate primaries. I also reviewed with him the options. At the present time he does not wish to be pursue an aggressive surgical approach. I think that that is very reasonable as this would involve a left upper lobectomy with chest wall resection. 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 I would recommend concurrent radiation therapy and chemotherapy.       Nestor Lewandowsky, MD 10/21/2016, 8:42 AM

## 2016-10-22 DIAGNOSIS — Z7189 Other specified counseling: Secondary | ICD-10-CM | POA: Insufficient documentation

## 2016-10-29 NOTE — Progress Notes (Signed)
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  INTERVAL HISTORY: Patient returns to clinic today for further evaluation, discussion of his final pathology results, and treatment planning. He recently was evaluated by thoracic surgery and determined not to be a surgical candidate. He denies any further hemoptysis. He does not complain of any further difficulty swallowing.  His voice is unchanged. He has no neurologic complaints. He has a fair appetite, but denies weight loss. He denies any chest pain, cough, or shortness of breath. He denies any nausea, vomiting, constipation, or diarrhea. He has no urinary complaints. Patient offers no further specific complaints.  REVIEW OF SYSTEMS:   Review of Systems  Constitutional: Negative.  Negative for fever, malaise/fatigue and weight loss.  HENT:       Speech changes  Respiratory: Negative for cough, hemoptysis, shortness of breath and stridor.   Cardiovascular: Negative.  Negative for chest pain and leg swelling.  Gastrointestinal: Negative.  Negative for abdominal pain, blood in stool and melena.  Genitourinary: Negative.   Musculoskeletal: Negative.   Skin: Negative.   Neurological: Negative.  Negative for sensory change and weakness.  Psychiatric/Behavioral: Negative.  The patient is not nervous/anxious.     As per HPI. Otherwise, a complete review of systems is negative.  PAST MEDICAL HISTORY: Past Medical History:  Diagnosis Date  . Arthritis    RHEUMATOID  . Cancer ([place])    SQUAMOUS CELL CARCINOMA LEFT LUNG JUST DIAGNOSED 10-16-16 BY DR Grayland Ormond PER WIFE  . GERD (gastroesophageal reflux disease)    OCC  . Osteoporosis   .  Sacral fracture ([place]) 09/2016    PAST SURGICAL HISTORY: Past Surgical History:  Procedure Laterality Date  . BACK SURGERY  1997   LUMBAR  . COLONOSCOPY    . MICROLARYNGOSCOPY N/A 10/19/2016   Procedure: MICROLARYNGOSCOPY;  Surgeon: Margaretha Sheffield, MD;  Location: [facility] ORS;  Service: ENT;  Laterality: N/A;  . NECK SURGERY    . RIGID BRONCHOSCOPY N/A 10/19/2016   Procedure: RIGID BRONCHOSCOPY;  Surgeon: Margaretha Sheffield, MD;  Location: [facility];  Service: ENT;  Laterality: N/A;  . RIGID ESOPHAGOSCOPY N/A 10/19/2016   Procedure: RIGID ESOPHAGOSCOPY WITH BIOPSIES OF EPIGLOTTIS;  Surgeon: Margaretha Sheffield, MD;  Location: [facility];  Service: ENT;  Laterality: N/A;    FAMILY HISTORY: Family History  Problem Relation Age of Onset  . Epilepsy Mother   . Cancer Neg Hx     ADVANCED DIRECTIVES (Y/N):  N  HEALTH MAINTENANCE: Social History  Substance Use Topics  . Smoking status: Former Smoker    Packs/day: 1.00    Years: 50.00    Types: Cigarettes    Quit date: 07/13/2012  . Smokeless tobacco: Never Used  . Alcohol use No     Colonoscopy:  PAP:  Bone density:  Lipid panel:  No Known Allergies  Current Outpatient Prescriptions  Medication Sig Dispense Refill  . amitriptyline (ELAVIL) 50 MG tablet Take 50 mg by mouth at bedtime.    . calcitonin, salmon, (MIACALCIN/FORTICAL) 200 UNIT/ACT nasal spray 1 spray at bedtime. Left nostril at 2130 every evening    . calcium carbonate (TUMS - DOSED IN MG ELEMENTAL CALCIUM) 500 MG chewable tablet Chew 1 tablet by mouth as needed for  indigestion or heartburn.    . Calcium-Magnesium-Vitamin D (CALCIUM 1200+D3 PO) Take 1 tablet by mouth daily.    . cyclobenzaprine (FLEXERIL) 10 MG tablet Take 10 mg by mouth 2 (two) times daily.    . folic acid (FOLVITE) 1 MG tablet Take 1 mg by mouth daily.    . furosemide (LASIX) 20 MG tablet Take 20 mg by mouth daily.    . hydroxychloroquine (PLAQUENIL) 200 MG tablet Take 200 mg by mouth 2 (two) times daily. Morning &  afternoon    . methotrexate 50 MG/2ML injection Inject 25 mg into the muscle every Saturday.     . Multiple Vitamin (MULTIVITAMIN WITH MINERALS) TABS tablet Take 1 tablet by mouth daily with breakfast.    . Omega-3 Fatty Acids (FISH OIL PO) Take 1 tablet by mouth daily.    Marland Kitchen oxyCODONE (ROXICODONE) 15 MG immediate release tablet Take 15 mg by mouth every 4 (four) hours.     . predniSONE (DELTASONE) 5 MG tablet Take 5 mg by mouth 2 (two) times daily with a meal.    . sulfaSALAzine (AZULFIDINE) 500 MG tablet Take 500 mg by mouth 2 (two) times daily.     Marland Kitchen lidocaine-prilocaine (EMLA) cream Apply to affected area once 30 g 3  . ondansetron (ZOFRAN) 8 MG tablet Take 1 tablet (8 mg total) by mouth 2 (two) times daily as needed for refractory nausea / vomiting. Start on day 3 after chemo. 60 tablet 2  . prochlorperazine (COMPAZINE) 10 MG tablet Take 1 tablet (10 mg total) by mouth every 6 (six) hours as needed (Nausea or vomiting). 60 tablet 2   No current facility-administered medications for this visit.     OBJECTIVE: Vitals:   10/30/16 0954  BP: (!) 156/66  Pulse: 88  Resp: 20  Temp: 98.9 F (37.2 C)     Body mass index is 27.58 kg/m.    ECOG FS:1 - Symptomatic but completely ambulatory  General: Well-developed, well-nourished, no acute distress. Eyes: Pink conjunctiva, anicteric sclera. HEENT: Normocephalic, moist mucous membranes, clear oropharnyx. Lungs: Clear to auscultation bilaterally. Heart: Regular rate and rhythm. No rubs, murmurs, or gallops. Abdomen: Soft, nontender, nondistended. No organomegaly noted, normoactive bowel sounds. Musculoskeletal: No edema, cyanosis, or clubbing. Neuro: Alert, answering all questions appropriately. Cranial nerves grossly intact. Skin: No rashes or petechiae noted. Psych: Normal affect.   LAB RESULTS:  Lab Results  Component Value Date   NA 133 (L) 09/27/2016   K 4.4 09/27/2016   CL 97 (L) 09/27/2016   CO2 30 09/27/2016   GLUCOSE 90  09/27/2016   BUN 9 09/27/2016   CREATININE 0.68 09/27/2016   CALCIUM 9.2 09/27/2016   PROT 7.2 09/27/2016   ALBUMIN 4.1 09/27/2016   AST 21 09/27/2016   ALT 16 (L) 09/27/2016   ALKPHOS 142 (H) 09/27/2016   BILITOT 0.8 09/27/2016   GFRNONAA >60 09/27/2016   GFRAA >60 09/27/2016    Lab Results  Component Value Date   WBC 7.9 10/14/2016   NEUTROABS 4.9 10/14/2016   HGB 12.9 (L) 10/14/2016   HCT 38.6 (L) 10/14/2016   MCV 101.0 (H) 10/14/2016   PLT 306 10/14/2016     STUDIES: Nm Pet Image Initial (pi) Skull Base To Thigh  Result Date: 10/07/2016 CLINICAL DATA:  Initial treatment strategy for left lung mass. EXAM: NUCLEAR MEDICINE PET SKULL BASE TO THIGH TECHNIQUE: 12.9 mCi F-18 FDG was injected intravenously. Full-ring PET imaging was performed from the skull base to thigh after the radiotracer. CT  data was obtained and used for attenuation correction and anatomic localization. FASTING BLOOD GLUCOSE:  Value: 100 mg/dl COMPARISON:  CT on 09/25/16 FINDINGS: NECK No hypermetabolic lymph nodes in the neck. Hypermetabolic soft tissue density is seen involving the epiglottis and left aryepiglottic fold. This measures 1.6 x 2.3 cm in maximum diameter, with SUV max of 12.7. Primary hypopharyngeal/supraglottic carcinoma cannot be excluded. CHEST 3.8 cm hypermetabolic mass is seen in the anterior left upper lobe with signs of left anterior chest wall invasion. This mass has SUV max of 17.3, and is consistent with primary bronchogenic carcinoma. Hypermetabolic activity is seen in the left inferior pleural space, corresponding with mild diffuse pleural thickening. This is likely reactive in etiology given evidence of chronic bibasilar pulmonary interstitial fibrosis. No focal pleural mass or pleural effusion identified. An area of FDG uptake is also seen corresponding to ill-defined airspace opacity in the posterior right upper lobe which is new since previous study and consistent with infectious or  inflammatory etiology. No hypermetabolic lymphadenopathy within the thorax . Aortic and coronary artery atherosclerosis. ABDOMEN/PELVIS No abnormal hypermetabolic activity within the liver, pancreas, adrenal glands, or spleen. No hypermetabolic lymph nodes in the abdomen or pelvis. Aortic atherosclerosis noted. SKELETON No focal hypermetabolic activity to suggest skeletal metastasis. Hypermetabolic activity is seen within the sacral ala bilaterally, consistent with healing sacral insufficiency fractures. IMPRESSION: 3.8 cm hypermetabolic mass in anterior left upper lobe, with left anterior chest wall invasion. This is consistent with primary bronchogenic carcinoma. No evidence of thoracic nodal or distant metastatic disease. 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 New posterior right upper lobe airspace opacity with FDG uptake, consistent with infectious or inflammatory etiology. Hypermetabolic soft tissue density involving the epiglottis and left aryepiglottic fold. Primary hypopharyngeal/supraglottic carcinoma cannot be excluded. Recommend ENT consultation for direct visualization. Healing bilateral sacral insufficiency fractures. Aortic and coronary artery atherosclerosis. Electronically Signed   By: Earle Gell M.D.   On: 10/07/2016 13:21   Ct Biopsy  Result Date: 10/14/2016 INDICATION: 76 year old with a left lung mass and epiglottis lesion. Tissue diagnosis is needed. EXAM: CT-GUIDED BIOPSY OF LEFT LUNG MASS MEDICATIONS: None. ANESTHESIA/SEDATION: Moderate (conscious) sedation was employed during this procedure. A total of Versed 1.0 mg and Fentanyl 25 mcg was administered intravenously. Moderate Sedation Time: 25 minutes. The patient's level of consciousness and vital signs were monitored continuously by radiology nursing throughout the procedure under my direct supervision. FLUOROSCOPY TIME:  None COMPLICATIONS: None  immediate. PROCEDURE: Informed written consent was obtained from the patient after a thorough discussion of the procedural risks, benefits and alternatives. All questions were addressed. Maximal Sterile Barrier Technique was utilized including caps, mask, sterile gowns, sterile gloves, sterile drape, hand hygiene and skin antiseptic. A timeout was performed prior to the initiation of the procedure. Patient was placed supine on the CT scanner. Images through the upper chest were obtained. The left upper lobe pleural-based lesion was identified. Left upper chest was shaved. Left upper chest was prepped and draped in a sterile fashion. Skin was anesthetized with 1% lidocaine. A 17 gauge coaxial needle was directed into the pleural-based lesion with CT guidance. Two core biopsies obtained with an 18 gauge core device. Specimens placed in formalin. 17 gauge needle was removed using a [employer] tract sealant. Follow up CT images were obtained. Bandage placed over the puncture site. FINDINGS: Pleural-based mass in the left upper lobe measuring up to 3.5 cm. Needle position confirmed within the lesion.  Two adequate core biopsies were obtained. No pneumothorax on the post biopsy images. IMPRESSION: Successful CT-guided core biopsy of the left upper lung mass. Electronically Signed   By: Markus Daft M.D.   On: 10/14/2016 14:02   Dg Chest Port 1 View  Result Date: 10/14/2016 CLINICAL DATA:  Left lung biopsy EXAM: PORTABLE CHEST 1 VIEW COMPARISON:  CT chest 10/14/2016. FINDINGS: 1219 hourschronic interstitial changes again noted. Cardiopericardial silhouette is at upper limits of normal for size. Left upper lobe pulmonary mass evident. No pneumothorax in the left hemithorax. The visualized bony structures of the thorax are intact. Telemetry leads overlie the chest. IMPRESSION: No evidence for left pneumothorax after lung mass biopsy. Electronically Signed   By: Misty Stanley M.D.   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 PLAN:  1. 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 This is likely a separate primary than the squamous cell carcinoma of his epiglottis. He has been evaluated by thoracic surgery and determined that he is not a surgical candidate. A referral was given to radiation oncology to pursue concurrent chemotherapy and XRT. He will get a port placement next week and then patient will return to clinic on Nov 11, 2016 to initiate cycle 1 of weekly carboplatinum and Taxol. 2. 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 Patient's margins were positive, therefore he will likely need local treatment with XRT and the concurrent chemotherapy was still above. Referral to radiation oncology as above. 3. Hypertension: Patient's blood pressure is elevated today, monitor.  Approximately 30 minutes was spent in discussion of which greater than 50% was consultation.  Patient expressed understanding and was in agreement with this plan. He also understands that He can call clinic at any time with any questions, concerns, or complaints.   Cancer Staging Malignant neoplasm of epiglottis ([place]) Staging form: Larynx - Supraglottis, AJCC 8th Edition - Clinical stage from 10/22/2016: Stage I (cT1, cN0, cM0) - Signed by Lloyd Huger, MD on 10/22/2016  Squamous cell carcinoma of left lung [facility]) Staging form: Lung, AJCC 8th Edition - Clinical stage from 10/16/2016: Stage IIA (cT2b, cN0, cM0) - Signed by Lloyd Huger, MD on 10/16/2016   Lloyd Huger, MD   11/01/2016 6:50 AM

## 2016-10-30 ENCOUNTER — Inpatient Hospital Stay (HOSPITAL_BASED_OUTPATIENT_CLINIC_OR_DEPARTMENT_OTHER): Payer: Medicare Other | Admitting: Oncology

## 2016-10-30 VITALS — BP 156/66 | HR 88 | Temp 98.9°F | Resp 20 | Wt 170.9 lb

## 2016-10-30 DIAGNOSIS — Z7952 Long term (current) use of systemic steroids: Secondary | ICD-10-CM

## 2016-10-30 DIAGNOSIS — I10 Essential (primary) hypertension: Secondary | ICD-10-CM

## 2016-10-30 DIAGNOSIS — R590 Localized enlarged lymph nodes: Secondary | ICD-10-CM

## 2016-10-30 DIAGNOSIS — R1310 Dysphagia, unspecified: Secondary | ICD-10-CM

## 2016-10-30 DIAGNOSIS — M069 Rheumatoid arthritis, unspecified: Secondary | ICD-10-CM

## 2016-10-30 DIAGNOSIS — C3411 Malignant neoplasm of upper lobe, right bronchus or lung: Secondary | ICD-10-CM

## 2016-10-30 DIAGNOSIS — M818 Other osteoporosis without current pathological fracture: Secondary | ICD-10-CM

## 2016-10-30 DIAGNOSIS — Z87891 Personal history of nicotine dependence: Secondary | ICD-10-CM | POA: Diagnosis not present

## 2016-10-30 DIAGNOSIS — C321 Malignant neoplasm of supraglottis: Secondary | ICD-10-CM | POA: Diagnosis not present

## 2016-10-30 DIAGNOSIS — Z79899 Other long term (current) drug therapy: Secondary | ICD-10-CM

## 2016-10-30 DIAGNOSIS — K219 Gastro-esophageal reflux disease without esophagitis: Secondary | ICD-10-CM

## 2016-10-30 DIAGNOSIS — I2510 Atherosclerotic heart disease of native coronary artery without angina pectoris: Secondary | ICD-10-CM | POA: Diagnosis not present

## 2016-10-30 DIAGNOSIS — Z8781 Personal history of (healed) traumatic fracture: Secondary | ICD-10-CM

## 2016-10-30 DIAGNOSIS — C3492 Malignant neoplasm of unspecified part of left bronchus or lung: Secondary | ICD-10-CM

## 2016-10-30 NOTE — Progress Notes (Signed)
Patient is here for follow up, they do have something they would like to discuss.

## 2016-10-30 NOTE — Progress Notes (Signed)
START ON PATHWAY REGIMEN - Non-Small Cell Lung     Administer weekly:     Paclitaxel      Carboplatin   **Always confirm dose/schedule in your pharmacy ordering system**    Patient Characteristics: Stage IIA/IIB - Unresectable AJCC T Category: TX Current Disease Status: No Distant Mets or Local Recurrence AJCC N Category: NX AJCC M Category: M0 AJCC 8 Stage Grouping: IIA Intent of Therapy: Curative Intent, Discussed with Patient 

## 2016-11-01 ENCOUNTER — Other Ambulatory Visit: Payer: Self-pay | Admitting: Oncology

## 2016-11-01 DIAGNOSIS — C3492 Malignant neoplasm of unspecified part of left bronchus or lung: Secondary | ICD-10-CM

## 2016-11-01 MED ORDER — ONDANSETRON HCL 8 MG PO TABS: 8.0000 mg | ORAL_TABLET | Freq: Two times a day (BID) | ORAL | 2 refills | Status: DC | PRN

## 2016-11-01 MED ORDER — LIDOCAINE-PRILOCAINE 2.5-2.5 % EX CREA: TOPICAL_CREAM | CUTANEOUS | 3 refills | Status: DC

## 2016-11-01 MED ORDER — PROCHLORPERAZINE MALEATE 10 MG PO TABS: 10.0000 mg | ORAL_TABLET | Freq: Four times a day (QID) | ORAL | 2 refills | Status: DC | PRN

## 2016-11-02 ENCOUNTER — Encounter: Payer: Self-pay | Admitting: Cardiothoracic Surgery

## 2016-11-02 ENCOUNTER — Inpatient Hospital Stay: Payer: Medicare Other

## 2016-11-02 ENCOUNTER — Encounter: Payer: Self-pay | Admitting: Emergency Medicine

## 2016-11-02 ENCOUNTER — Emergency Department: Payer: Medicare Other

## 2016-11-02 ENCOUNTER — Ambulatory Visit (INDEPENDENT_AMBULATORY_CARE_PROVIDER_SITE_OTHER): Payer: Medicare Other | Admitting: Cardiothoracic Surgery

## 2016-11-02 ENCOUNTER — Inpatient Hospital Stay: Admission: RE | Admit: 2016-11-02 | Payer: Medicare Other | Source: Ambulatory Visit

## 2016-11-02 ENCOUNTER — Telehealth: Payer: Self-pay

## 2016-11-02 ENCOUNTER — Inpatient Hospital Stay
Admission: EM | Admit: 2016-11-02 | Discharge: 2016-11-09 | DRG: 193 | Disposition: A | Discharge status: Hospice | Payer: Medicare Other | Attending: Internal Medicine | Admitting: Internal Medicine

## 2016-11-02 VITALS — BP 140/86 | HR 96 | Temp 98.3°F | Ht 66.0 in | Wt 173.0 lb

## 2016-11-02 DIAGNOSIS — R0609 Other forms of dyspnea: Secondary | ICD-10-CM

## 2016-11-02 DIAGNOSIS — J189 Pneumonia, unspecified organism: Principal | ICD-10-CM | POA: Diagnosis present

## 2016-11-02 DIAGNOSIS — Z79891 Long term (current) use of opiate analgesic: Secondary | ICD-10-CM

## 2016-11-02 DIAGNOSIS — R918 Other nonspecific abnormal finding of lung field: Secondary | ICD-10-CM

## 2016-11-02 DIAGNOSIS — J9621 Acute and chronic respiratory failure with hypoxia: Secondary | ICD-10-CM | POA: Diagnosis present

## 2016-11-02 DIAGNOSIS — Y95 Nosocomial condition: Secondary | ICD-10-CM | POA: Diagnosis present

## 2016-11-02 DIAGNOSIS — K219 Gastro-esophageal reflux disease without esophagitis: Secondary | ICD-10-CM | POA: Diagnosis present

## 2016-11-02 DIAGNOSIS — J441 Chronic obstructive pulmonary disease with (acute) exacerbation: Secondary | ICD-10-CM | POA: Diagnosis present

## 2016-11-02 DIAGNOSIS — G8929 Other chronic pain: Secondary | ICD-10-CM | POA: Diagnosis present

## 2016-11-02 DIAGNOSIS — R0602 Shortness of breath: Secondary | ICD-10-CM | POA: Diagnosis present

## 2016-11-02 DIAGNOSIS — Z79899 Other long term (current) drug therapy: Secondary | ICD-10-CM

## 2016-11-02 DIAGNOSIS — J8410 Pulmonary fibrosis, unspecified: Secondary | ICD-10-CM | POA: Diagnosis present

## 2016-11-02 DIAGNOSIS — Z7952 Long term (current) use of systemic steroids: Secondary | ICD-10-CM | POA: Diagnosis not present

## 2016-11-02 DIAGNOSIS — R5383 Other fatigue: Secondary | ICD-10-CM | POA: Diagnosis not present

## 2016-11-02 DIAGNOSIS — M069 Rheumatoid arthritis, unspecified: Secondary | ICD-10-CM | POA: Diagnosis not present

## 2016-11-02 DIAGNOSIS — J181 Lobar pneumonia, unspecified organism: Secondary | ICD-10-CM | POA: Diagnosis not present

## 2016-11-02 DIAGNOSIS — Z7189 Other specified counseling: Secondary | ICD-10-CM | POA: Diagnosis not present

## 2016-11-02 DIAGNOSIS — Z66 Do not resuscitate: Secondary | ICD-10-CM | POA: Diagnosis not present

## 2016-11-02 DIAGNOSIS — R531 Weakness: Secondary | ICD-10-CM | POA: Diagnosis not present

## 2016-11-02 DIAGNOSIS — Z8781 Personal history of (healed) traumatic fracture: Secondary | ICD-10-CM | POA: Diagnosis not present

## 2016-11-02 DIAGNOSIS — C3492 Malignant neoplasm of unspecified part of left bronchus or lung: Secondary | ICD-10-CM

## 2016-11-02 DIAGNOSIS — R0902 Hypoxemia: Secondary | ICD-10-CM

## 2016-11-02 DIAGNOSIS — C321 Malignant neoplasm of supraglottis: Secondary | ICD-10-CM | POA: Diagnosis not present

## 2016-11-02 DIAGNOSIS — M810 Age-related osteoporosis without current pathological fracture: Secondary | ICD-10-CM | POA: Diagnosis present

## 2016-11-02 DIAGNOSIS — Z87891 Personal history of nicotine dependence: Secondary | ICD-10-CM | POA: Diagnosis not present

## 2016-11-02 DIAGNOSIS — I248 Other forms of acute ischemic heart disease: Secondary | ICD-10-CM | POA: Diagnosis present

## 2016-11-02 DIAGNOSIS — C3412 Malignant neoplasm of upper lobe, left bronchus or lung: Secondary | ICD-10-CM | POA: Diagnosis not present

## 2016-11-02 DIAGNOSIS — J440 Chronic obstructive pulmonary disease with acute lower respiratory infection: Secondary | ICD-10-CM | POA: Diagnosis present

## 2016-11-02 DIAGNOSIS — Z515 Encounter for palliative care: Secondary | ICD-10-CM | POA: Diagnosis present

## 2016-11-02 DIAGNOSIS — C3490 Malignant neoplasm of unspecified part of unspecified bronchus or lung: Secondary | ICD-10-CM

## 2016-11-02 DIAGNOSIS — Z794 Long term (current) use of insulin: Secondary | ICD-10-CM | POA: Diagnosis not present

## 2016-11-02 LAB — BASIC METABOLIC PANEL
ANION GAP: 9 (ref 5–15)
BUN: 13 mg/dL (ref 6–20)
CHLORIDE: 94 mmol/L — AB (ref 101–111)
CO2: 29 mmol/L (ref 22–32)
CREATININE: 0.66 mg/dL (ref 0.61–1.24)
Calcium: 8.4 mg/dL — ABNORMAL LOW (ref 8.9–10.3)
GFR calc non Af Amer: >60 mL/min (ref >60)
Glucose, Bld: 96 mg/dL (ref 65–99)
POTASSIUM: 4.2 mmol/L (ref 3.5–5.1)
SODIUM: 132 mmol/L — AB (ref 135–145)

## 2016-11-02 LAB — CBC WITH DIFFERENTIAL/PLATELET
BASOS ABS: 0.1 10*3/uL (ref 0–0.1)
BASOS PCT: 1 %
EOS ABS: 0.1 10*3/uL (ref 0–0.7)
Eosinophils Relative: 0 %
HEMATOCRIT: 37.4 % — AB (ref 40.0–52.0)
HEMOGLOBIN: 12.4 g/dL — AB (ref 13.0–18.0)
Lymphocytes Relative: 6 %
Lymphs Abs: 1 10*3/uL (ref 1.0–3.6)
MCH: 32.5 pg (ref 26.0–34.0)
MCHC: 33.2 g/dL (ref 32.0–36.0)
MCV: 97.9 fL (ref 80.0–100.0)
Monocytes Absolute: 0.6 10*3/uL (ref 0.2–1.0)
Monocytes Relative: 4 %
NEUTROS ABS: 14.2 10*3/uL — AB (ref 1.4–6.5)
NEUTROS PCT: 89 %
Platelets: 395 10*3/uL (ref 150–440)
RBC: 3.82 MIL/uL — AB (ref 4.40–5.90)
RDW: 14.4 % (ref 11.5–14.5)
WBC: 15.9 10*3/uL — AB (ref 3.8–10.6)

## 2016-11-02 LAB — LACTIC ACID, PLASMA
LACTIC ACID, VENOUS: 1.1 mmol/L (ref 0.5–1.9)
LACTIC ACID, VENOUS: 1.3 mmol/L (ref 0.5–1.9)

## 2016-11-02 LAB — TROPONIN I: TROPONIN I: 0.08 ng/mL — AB (ref <0.03)

## 2016-11-02 MED ORDER — METHOTREXATE SODIUM CHEMO INJECTION 50 MG/2ML: 25.0000 mg | INTRAMUSCULAR | Status: DC

## 2016-11-02 MED ORDER — CHLORHEXIDINE GLUCONATE 0.12 % MT SOLN
15.0000 mL | Freq: Two times a day (BID) | OROMUCOSAL | Status: DC
  Administered 2016-11-02 – 2016-11-09 (×14): 15 mL via OROMUCOSAL
  Filled 2016-11-02 (×15): qty 15

## 2016-11-02 MED ORDER — IPRATROPIUM-ALBUTEROL 0.5-2.5 (3) MG/3ML IN SOLN
3.0000 mL | Freq: Four times a day (QID) | RESPIRATORY_TRACT | Status: DC
  Administered 2016-11-02 – 2016-11-09 (×27): 3 mL via RESPIRATORY_TRACT
  Filled 2016-11-02 (×28): qty 3

## 2016-11-02 MED ORDER — METHYLPREDNISOLONE SODIUM SUCC 125 MG IJ SOLR
60.0000 mg | Freq: Four times a day (QID) | INTRAMUSCULAR | Status: DC
  Administered 2016-11-02 – 2016-11-03 (×4): 60 mg via INTRAVENOUS
  Filled 2016-11-02 (×3): qty 2

## 2016-11-02 MED ORDER — ONDANSETRON HCL 4 MG/2ML IJ SOLN: 4.0000 mg | Freq: Four times a day (QID) | INTRAMUSCULAR | Status: DC | PRN

## 2016-11-02 MED ORDER — ENOXAPARIN SODIUM 40 MG/0.4ML ~~LOC~~ SOLN
40.0000 mg | SUBCUTANEOUS | Status: DC
  Administered 2016-11-02 – 2016-11-08 (×7): 40 mg via SUBCUTANEOUS
  Filled 2016-11-02 (×7): qty 0.4

## 2016-11-02 MED ORDER — PROCHLORPERAZINE MALEATE 10 MG PO TABS
10.0000 mg | ORAL_TABLET | Freq: Four times a day (QID) | ORAL | Status: DC | PRN
  Filled 2016-11-02: qty 1

## 2016-11-02 MED ORDER — SODIUM CHLORIDE 0.9% FLUSH: 3.0000 mL | INTRAVENOUS | Status: DC | PRN

## 2016-11-02 MED ORDER — ACETAMINOPHEN 325 MG PO TABS: 650.0000 mg | ORAL_TABLET | Freq: Four times a day (QID) | ORAL | Status: DC | PRN

## 2016-11-02 MED ORDER — ORAL CARE MOUTH RINSE
15.0000 mL | Freq: Two times a day (BID) | OROMUCOSAL | Status: DC
  Administered 2016-11-03 – 2016-11-09 (×13): 15 mL via OROMUCOSAL

## 2016-11-02 MED ORDER — SODIUM CHLORIDE 0.9 % IV BOLUS (SEPSIS)
1000.0000 mL | Freq: Once | INTRAVENOUS | Status: AC
  Administered 2016-11-02: 1000 mL via INTRAVENOUS

## 2016-11-02 MED ORDER — ACETAMINOPHEN 650 MG RE SUPP: 650.0000 mg | Freq: Four times a day (QID) | RECTAL | Status: DC | PRN

## 2016-11-02 MED ORDER — OXYCODONE HCL 5 MG PO TABS
15.0000 mg | ORAL_TABLET | ORAL | Status: DC
  Administered 2016-11-02 – 2016-11-09 (×41): 15 mg via ORAL
  Filled 2016-11-02 (×41): qty 3

## 2016-11-02 MED ORDER — IOPAMIDOL (ISOVUE-300) INJECTION 61%
75.0000 mL | Freq: Once | INTRAVENOUS | Status: AC | PRN
  Administered 2016-11-02: 20:00:00 75 mL via INTRAVENOUS

## 2016-11-02 MED ORDER — OXYCODONE HCL 5 MG PO TABS
5.0000 mg | ORAL_TABLET | ORAL | Status: DC | PRN
Start: 2016-11-02 — End: 2016-11-04

## 2016-11-02 MED ORDER — CALCITONIN (SALMON) 200 UNIT/ACT NA SOLN
1.0000 | Freq: Every day | NASAL | Status: DC
  Administered 2016-11-02 – 2016-11-08 (×7): 1 via NASAL
  Filled 2016-11-02 (×2): qty 3.7

## 2016-11-02 MED ORDER — LEVOFLOXACIN IN D5W 500 MG/100ML IV SOLN
500.0000 mg | INTRAVENOUS | Status: DC
  Administered 2016-11-02 – 2016-11-03 (×2): 500 mg via INTRAVENOUS
  Filled 2016-11-02 (×3): qty 100

## 2016-11-02 MED ORDER — METHYLPREDNISOLONE SODIUM SUCC 125 MG IJ SOLR
INTRAMUSCULAR | Status: AC
  Administered 2016-11-02: 60 mg via INTRAVENOUS
  Filled 2016-11-02: qty 2

## 2016-11-02 MED ORDER — FUROSEMIDE 20 MG PO TABS
20.0000 mg | ORAL_TABLET | Freq: Every day | ORAL | Status: DC
  Administered 2016-11-02 – 2016-11-05 (×4): 20 mg via ORAL
  Filled 2016-11-02 (×4): qty 1

## 2016-11-02 MED ORDER — IPRATROPIUM-ALBUTEROL 0.5-2.5 (3) MG/3ML IN SOLN
RESPIRATORY_TRACT | Status: AC
  Administered 2016-11-02: 3 mL via RESPIRATORY_TRACT
  Filled 2016-11-02: qty 3

## 2016-11-02 MED ORDER — AMITRIPTYLINE HCL 25 MG PO TABS
50.0000 mg | ORAL_TABLET | Freq: Every day | ORAL | Status: DC
  Administered 2016-11-02 – 2016-11-08 (×7): 50 mg via ORAL
  Filled 2016-11-02 (×7): qty 2

## 2016-11-02 MED ORDER — VANCOMYCIN HCL IN DEXTROSE 1-5 GM/200ML-% IV SOLN
1000.0000 mg | Freq: Once | INTRAVENOUS | Status: AC
  Administered 2016-11-02: 1000 mg via INTRAVENOUS
  Filled 2016-11-02: qty 200

## 2016-11-02 MED ORDER — ONDANSETRON HCL 4 MG PO TABS: 4.0000 mg | ORAL_TABLET | Freq: Four times a day (QID) | ORAL | Status: DC | PRN

## 2016-11-02 MED ORDER — DEXTROSE 5 % IV SOLN
2.0000 g | Freq: Once | INTRAVENOUS | Status: AC
  Administered 2016-11-02: 21:00:00 2 g via INTRAVENOUS
  Filled 2016-11-02: qty 2

## 2016-11-02 MED ORDER — SODIUM CHLORIDE 0.9% FLUSH
3.0000 mL | Freq: Two times a day (BID) | INTRAVENOUS | Status: DC
  Administered 2016-11-02 – 2016-11-09 (×14): 3 mL via INTRAVENOUS

## 2016-11-02 MED ORDER — ONDANSETRON HCL 4 MG PO TABS: 8.0000 mg | ORAL_TABLET | Freq: Two times a day (BID) | ORAL | Status: DC | PRN

## 2016-11-02 MED ORDER — SODIUM CHLORIDE 0.9 % IV SOLN: 250.0000 mL | INTRAVENOUS | Status: DC | PRN

## 2016-11-02 MED ORDER — CEFEPIME HCL 2 G IJ SOLR
2.0000 g | Freq: Once | INTRAMUSCULAR | Status: DC
  Filled 2016-11-02: qty 2

## 2016-11-02 NOTE — ED Provider Notes (Signed)
[facility] Emergency Department Provider Note   ____________________________________________   I have reviewed the triage vital signs and the nursing notes.   HISTORY  Chief Complaint Low oxygen saturation and Shortness of Breath   History limited by: Not Limited   HPI Dean Davidson is a 76 y.o. male who presents to the emergency department today from Dr. Lenard Forth office because of concerns for low oxygenation saturation. The patient has been having worsening shortness breath for the past 5 days. Has been accompanied by cough. The patient has been having lots of gas pain and even told the wife on point that he didn't think you're to be able to draw another breath. She did want him to come to the emergency department however he refused. Was at Dr. Faith Rogue office today for a preadmission for port placement for chemotherapy. While there the O2 sat was found to be in the 80s on room air. Patient denies any fevers.   Past Medical History:  Diagnosis Date  . Arthritis    RHEUMATOID  . Cancer ([place])    SQUAMOUS CELL CARCINOMA LEFT LUNG JUST DIAGNOSED 10-16-16 BY DR Grayland Ormond PER WIFE  . GERD (gastroesophageal reflux disease)    OCC  . Osteoporosis   . Sacral fracture ([place]) 09/2016    Patient Active Problem List   Diagnosis Date Noted  . Goals of care, counseling/discussion 10/22/2016  . Chronic pain 10/19/2016  . Eczema 10/19/2016  . Knee pain 10/19/2016  . Pure hypercholesterolemia 10/19/2016  . Rheumatoid arthritis, adult ([place]) 10/19/2016  . 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 Mass of epiglottis 10/16/2016  . 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 DDD (degenerative disc disease), lumbar 12/25/2013  . Pulmonary fibrosis ([place]) 12/25/2013  . Hearing loss 09/04/2013    Past Surgical History:  Procedure Laterality Date  . BACK SURGERY  1997   LUMBAR  . COLONOSCOPY    . MICROLARYNGOSCOPY N/A 10/19/2016   Procedure:  MICROLARYNGOSCOPY;  Surgeon: Margaretha Sheffield, MD;  Location: [facility] ORS;  Service: ENT;  Laterality: N/A;  . NECK SURGERY    . RIGID BRONCHOSCOPY N/A 10/19/2016   Procedure: RIGID BRONCHOSCOPY;  Surgeon: Margaretha Sheffield, MD;  Location: [facility] ORS;  Service: ENT;  Laterality: N/A;  . RIGID ESOPHAGOSCOPY N/A 10/19/2016   Procedure: RIGID ESOPHAGOSCOPY WITH BIOPSIES OF EPIGLOTTIS;  Surgeon: Margaretha Sheffield, MD;  Location: [facility] ORS;  Service: ENT;  Laterality: N/A;    Prior to Admission medications   Medication Sig Start Date End Date Taking? Authorizing Provider  amitriptyline (ELAVIL) 50 MG tablet Take 50 mg by mouth at bedtime.    Historical Provider, MD  calcitonin, salmon, (MIACALCIN/FORTICAL) 200 UNIT/ACT nasal spray 1 spray at bedtime. Left nostril at 2130 every evening    Historical Provider, MD  calcium carbonate (TUMS - DOSED IN MG ELEMENTAL CALCIUM) 500 MG chewable tablet Chew 1 tablet by mouth as needed for indigestion or heartburn.    Historical Provider, MD  Calcium-Magnesium-Vitamin D (CALCIUM 1200+D3 PO) Take 1 tablet by mouth daily.    Historical Provider, MD  cyclobenzaprine (FLEXERIL) 10 MG tablet Take 10 mg by mouth 2 (two) times daily.    Historical Provider, MD  folic acid (FOLVITE) 1 MG tablet Take 1 mg by mouth daily.    Historical Provider, MD  furosemide (LASIX) 20 MG tablet Take 20 mg by mouth daily.    Historical Provider, MD  lidocaine-prilocaine (EMLA) cream Apply to affected area once 11/01/16   Christia Reading  Bridget Hartshorn, MD  methotrexate 50 MG/2ML injection Inject 25 mg into the muscle every Saturday.     Historical Provider, MD  Multiple Vitamin (MULTIVITAMIN WITH MINERALS) TABS tablet Take 1 tablet by mouth daily with breakfast.    Historical Provider, MD  Omega-3 Fatty Acids (FISH OIL PO) Take 1 tablet by mouth daily.    Historical Provider, MD  ondansetron (ZOFRAN) 8 MG tablet Take 1 tablet (8 mg total) by mouth 2 (two) times daily as needed for refractory nausea / vomiting. Start on day  3 after chemo. 11/01/16   Lloyd Huger, MD  oxyCODONE (ROXICODONE) 15 MG immediate release tablet Take 15 mg by mouth every 4 (four) hours.     Historical Provider, MD  predniSONE (DELTASONE) 5 MG tablet Take 5 mg by mouth 2 (two) times daily with a meal.    Historical Provider, MD  prochlorperazine (COMPAZINE) 10 MG tablet TAKE 1 TABLET BY MOUTH EVERY 6 HOURS AS NEEDED 11/01/16   Lloyd Huger, MD    Allergies Patient has no known allergies.  Family History  Problem Relation Age of Onset  . Epilepsy Mother   . Cancer Neg Hx     Social History Social History  Substance Use Topics  . Smoking status: Former Smoker    Packs/day: 1.00    Years: 50.00    Types: Cigarettes    Quit date: 07/13/2012  . Smokeless tobacco: Never Used  . Alcohol use No    Review of Systems Constitutional: No fever/chills Eyes: No visual changes. ENT: Occasional sore throat with dry weather. Cardiovascular: Denies chest pain. Respiratory: Positive shortness of breath. Gastrointestinal: No abdominal pain.  No nausea, no vomiting.  No diarrhea.   Genitourinary: Negative for dysuria. Musculoskeletal: Negative for back pain. Skin: Negative for rash. Neurological: Negative for headaches, focal weakness or numbness.  ____________________________________________   PHYSICAL EXAM:  VITAL SIGNS: ED Triage Vitals [11/02/16 1137]  Enc Vitals Group     BP (!) 159/79     Pulse Rate 96     Resp 20     Temp 97.8 F (36.6 C)     Temp Source Oral     SpO2 97 %     Weight 174 lb (78.9 kg)     Height '5\' 11"'$  (1.803 m)     Head Circumference      Peak Flow      Pain Score 0   Constitutional: Alert and oriented. Well appearing and in no distress. Eyes: Conjunctivae are normal. Normal extraocular movements. ENT   Head: Normocephalic and atraumatic.   Nose: No congestion/rhinnorhea.   Mouth/Throat: Mucous membranes are moist.   Neck: No stridor. Hematological/Lymphatic/Immunilogical:  No cervical lymphadenopathy. Cardiovascular: Normal rate, regular rhythm.  No murmurs, rubs, or gallops.  Respiratory: Increased respiratory effort. Some rhonchi noted in bilateral lungs, with diminished breath sounds in left lower lung.  Gastrointestinal: Soft and non tender. No rebound. No guarding.  Genitourinary: Deferred Musculoskeletal: Normal range of motion in all extremities. No lower extremity edema. Neurologic:  Normal speech and language. No gross focal neurologic deficits are appreciated.  Skin:  Skin is warm, dry and intact. No rash noted. Psychiatric: Mood and affect are normal. Speech and behavior are normal. Patient exhibits appropriate insight and judgment.  ____________________________________________    LABS (pertinent positives/negatives)  Labs Reviewed  CBC WITH DIFFERENTIAL/PLATELET - Abnormal; Notable for the following:       Result Value   WBC 15.9 (*)    RBC 3.82 (*)  Hemoglobin 12.4 (*)    HCT 37.4 (*)    Neutro Abs 14.2 (*)    All other components within normal limits  BASIC METABOLIC PANEL - Abnormal; Notable for the following:    Sodium 132 (*)    Chloride 94 (*)    Calcium 8.4 (*)    All other components within normal limits  TROPONIN I - Abnormal; Notable for the following:    Troponin I 0.08 (*)    All other components within normal limits  CULTURE, BLOOD (ROUTINE X 2)  CULTURE, BLOOD (ROUTINE X 2)  LACTIC ACID, PLASMA  LACTIC ACID, PLASMA     ____________________________________________   EKG  I, Nance Pear, attending physician, personally viewed and interpreted this EKG  EKG Time: 1148 Rate: 89 Rhythm: normal sinus rhythm Axis: normal Intervals: qtc 445 QRS: narrow, q waves V1 ST changes: no st elevation, t wave inversion V1 Impression: abnormal ekg  ____________________________________________    RADIOLOGY  CXR IMPRESSION:  New airspace consolidation consistent with pneumonia anterior  segment right upper lobe.  Persistent mass left upper lobe, not  appreciably changed. Areas of patchy airspace consolidation in both  lung bases, more on the left than on the right, stable. Underlying  interstitial fibrosis in the bases. Stable cardiac silhouette. No  adenopathy demonstrable.    ____________________________________________   PROCEDURES  Procedures  CRITICAL CARE Performed by: Nance Pear   Total critical care time: 35 minutes  Critical care time was exclusive of separately billable procedures and treating other patients.  Critical care was necessary to treat or prevent imminent or life-threatening deterioration.  Critical care was time spent personally by me on the following activities: development of treatment plan with patient and/or surrogate as well as nursing, discussions with consultants, evaluation of patient's response to treatment, examination of patient, obtaining history from patient or surrogate, ordering and performing treatments and interventions, ordering and review of laboratory studies, ordering and review of radiographic studies, pulse oximetry and re-evaluation of patient's condition.  ____________________________________________   INITIAL IMPRESSION / ASSESSMENT AND PLAN / ED COURSE  Pertinent labs & imaging results that were available during my care of the patient were reviewed by me and considered in my medical decision making (see chart for details).  Patient presented to the emergency department today from [place] office because of concerns for oxygen saturation in the ED's. He apparently has been having shortness of breath for the past 5 days. Breath sounds were somewhat diminished on the left side however he did have rhonchi bilateral bases. Chest x-ray is consistent with pneumonia. Furthermore blood work with elevated leukocytosis consistent with infection. Patient's troponin was minimally elevated 0.08. At this point I doubt this represents ACS at the more  likely secondary to possible sepsis. Patient will be given IV antibiotics and admitted to the hospitalist service.  ____________________________________________   FINAL CLINICAL IMPRESSION(S) / ED DIAGNOSES  Final diagnoses:  HCAP (healthcare-associated pneumonia)  SOB (shortness of breath)  Hypoxia     Note: This dictation was prepared with Dragon dictation. Any transcriptional errors that result from this process are unintentional     Nance Pear, MD 11/02/16 [phone]

## 2016-11-02 NOTE — H&P (Signed)
[facility] [place] NAME: Dean Davidson    MR#:  [id number]  DATE OF BIRTH:  [birth year]  DATE OF ADMISSION:  11/02/2016  PRIMARY CARE PHYSICIAN: Hortencia Pilar, MD   REQUESTING/REFERRING PHYSICIAN: Nance Pear MD  CHIEF COMPLAINT:   Chief Complaint  Patient presents with  . Low oxygen saturation  . Shortness of Breath    HISTORY OF PRESENT ILLNESS: Dean Davidson  is a 76 y.o. male with a known history of Rheumatoid arthritis, GERD, new diagnosis squamous cell carcinoma of the left lung, osteoporosis as well as recent diagnosis of tumor in the throat who went to see Dr. Faith Rogue for Port-A-Cath placement to initiate chemotherapy. In the office patient was very short of breath and oxygen was low. Therefore he was sent to the emergency room. Patient reports that he's been having shortness of breath for the past few days. Progressively gotten worse. He has had productive cough. And has had chills but no fevers. Patient denies any nausea vomiting or diarrhea denies any urinary symptoms. Patient's chest x-ray showed that he has pneumonia. PAST MEDICAL HISTORY:   Past Medical History:  Diagnosis Date  . Arthritis    RHEUMATOID  . Cancer ([place])    SQUAMOUS CELL CARCINOMA LEFT LUNG JUST DIAGNOSED 10-16-16 BY DR Grayland Ormond PER WIFE  . GERD (gastroesophageal reflux disease)    OCC  . Osteoporosis   . Sacral fracture ([place]) 09/2016    PAST SURGICAL HISTORY: Past Surgical History:  Procedure Laterality Date  . BACK SURGERY  1997   LUMBAR  . COLONOSCOPY    . MICROLARYNGOSCOPY N/A 10/19/2016   Procedure: MICROLARYNGOSCOPY;  Surgeon: Margaretha Sheffield, MD;  Location: [facility] ORS;  Service: ENT;  Laterality: N/A;  . NECK SURGERY    . RIGID BRONCHOSCOPY N/A 10/19/2016   Procedure: RIGID BRONCHOSCOPY;  Surgeon: Margaretha Sheffield, MD;  Location: [facility] ORS;  Service: ENT;  Laterality: N/A;  . RIGID ESOPHAGOSCOPY N/A 10/19/2016   Procedure: RIGID ESOPHAGOSCOPY WITH  BIOPSIES OF EPIGLOTTIS;  Surgeon: Margaretha Sheffield, MD;  Location: [facility] ORS;  Service: ENT;  Laterality: N/A;    SOCIAL HISTORY:  Social History  Substance Use Topics  . Smoking status: Former Smoker    Packs/day: 1.00    Years: 50.00    Types: Cigarettes    Quit date: 07/13/2012  . Smokeless tobacco: Never Used  . Alcohol use No    FAMILY HISTORY:  Family History  Problem Relation Age of Onset  . Epilepsy Mother   . Cancer Neg Hx     DRUG ALLERGIES: No Known Allergies  REVIEW OF SYSTEMS:   CONSTITUTIONAL: No fever, fatigue or weakness.  EYES: No blurred or double vision.  EARS, NOSE, AND THROAT: No tinnitus or ear pain.  RESPIRATORY: No cough, + shortness of breath, +wheezing or hemoptysis.  CARDIOVASCULAR: No chest pain, orthopnea, edema.  GASTROINTESTINAL: No nausea, vomiting, diarrhea or abdominal pain.  GENITOURINARY: No dysuria, hematuria.  ENDOCRINE: No polyuria, nocturia,  HEMATOLOGY: No anemia, easy bruising or bleeding SKIN: No rash or lesion. MUSCULOSKELETAL: No joint pain or arthritis.   NEUROLOGIC: No tingling, numbness, weakness.  PSYCHIATRY: No anxiety or depression.   MEDICATIONS AT HOME:  Prior to Admission medications   Medication Sig Start Date End Date Taking? Authorizing Provider  amitriptyline (ELAVIL) 50 MG tablet Take 50 mg by mouth at bedtime.   Yes Historical Provider, MD  calcitonin, salmon, (MIACALCIN/FORTICAL) 200 UNIT/ACT nasal spray 1 spray at bedtime. Left nostril at 2130  every evening   Yes Historical Provider, MD  furosemide (LASIX) 20 MG tablet Take 20 mg by mouth daily.   Yes Historical Provider, MD  methotrexate 50 MG/2ML injection Inject 25 mg into the muscle every Saturday.    Yes Historical Provider, MD  oxyCODONE (ROXICODONE) 15 MG immediate release tablet Take 15 mg by mouth every 4 (four) hours.    Yes Historical Provider, MD  predniSONE (DELTASONE) 5 MG tablet Take 5 mg by mouth 2 (two) times daily with a meal.   Yes Historical  Provider, MD  lidocaine-prilocaine (EMLA) cream Apply to affected area once Patient not taking: Reported on 11/02/2016 11/01/16   Lloyd Huger, MD  ondansetron (ZOFRAN) 8 MG tablet Take 1 tablet (8 mg total) by mouth 2 (two) times daily as needed for refractory nausea / vomiting. Start on day 3 after chemo. Patient not taking: Reported on 11/02/2016 11/01/16   Lloyd Huger, MD  prochlorperazine (COMPAZINE) 10 MG tablet TAKE 1 TABLET BY MOUTH EVERY 6 HOURS AS NEEDED Patient not taking: Reported on 11/02/2016 11/01/16   Lloyd Huger, MD      PHYSICAL EXAMINATION:   VITAL SIGNS: Blood pressure (!) 147/78, pulse 92, temperature 97.8 F (36.6 C), temperature source Oral, resp. rate 19, height '5\' 11"'$  (1.803 m), weight 174 lb (78.9 kg), SpO2 94 %.  GENERAL:  76 y.o.-year-old patient lying in the bed with no acute distress.  EYES: Pupils equal, round, reactive to light and accommodation. No scleral icterus. Extraocular muscles intact.  HEENT: Head atraumatic, normocephalic. Oropharynx and nasopharynx clear.  NECK:  Supple, no jugular venous distention. No thyroid enlargement, no tenderness.  LUNGS: Bilateral wheezing throughout both lungs with no sensory muscle usage no wheezing CARDIOVASCULAR: S1, S2 normal. No murmurs, rubs, or gallops.  ABDOMEN: Soft, nontender, nondistended. Bowel sounds present. No organomegaly or mass.  EXTREMITIES: No pedal edema, cyanosis, or clubbing.  NEUROLOGIC: Cranial nerves II through XII are intact. Muscle strength 5/5 in all extremities. Sensation intact. Gait not checked.  PSYCHIATRIC: The patient is alert and oriented x 3.  SKIN: No obvious rash, lesion, or ulcer.   LABORATORY PANEL:   CBC  Recent Labs Lab 11/02/16 1209  WBC 15.9*  HGB 12.4*  HCT 37.4*  PLT 395  MCV 97.9  MCH 32.5  MCHC 33.2  RDW 14.4  LYMPHSABS 1.0  MONOABS 0.6  EOSABS 0.1  BASOSABS 0.1    ------------------------------------------------------------------------------------------------------------------  Chemistries   Recent Labs Lab 11/02/16 1237  NA 132*  K 4.2  CL 94*  CO2 29  GLUCOSE 96  BUN 13  CREATININE 0.66  CALCIUM 8.4*   ------------------------------------------------------------------------------------------------------------------ estimated creatinine clearance is 86.3 mL/min (by C-G formula based on SCr of 0.66 mg/dL). ------------------------------------------------------------------------------------------------------------------ No results for input(s): TSH, T4TOTAL, T3FREE, THYROIDAB in the last 72 hours.  Invalid input(s): FREET3   Coagulation profile No results for input(s): INR, PROTIME in the last 168 hours. ------------------------------------------------------------------------------------------------------------------- No results for input(s): DDIMER in the last 72 hours. -------------------------------------------------------------------------------------------------------------------  Cardiac Enzymes  Recent Labs Lab 11/02/16 1237  TROPONINI 0.08*   ------------------------------------------------------------------------------------------------------------------ Invalid input(s): POCBNP  ---------------------------------------------------------------------------------------------------------------  Urinalysis No results found for: COLORURINE, APPEARANCEUR, LABSPEC, PHURINE, GLUCOSEU, HGBUR, BILIRUBINUR, KETONESUR, PROTEINUR, UROBILINOGEN, NITRITE, LEUKOCYTESUR   RADIOLOGY: Dg Chest 2 View  Result Date: 11/02/2016 CLINICAL DATA:  Shortness of breath.  Left-sided lung carcinoma EXAM: CHEST  2 VIEW COMPARISON:  Chest radiograph September 25, 2016 and chest radiograph October 14, 2016 FINDINGS: There is a mass in the anterior segment of  the left upper lobe measuring 3.7 x 3.5 x 2.6 cm. There is underlying interstitial fibrosis. There  is persistent patchy airspace consolidation in the lung bases bilaterally, more pronounced on the left than on the right. There is new patchy airspace consolidation in the anterior segment of the right upper lobe. Heart size and pulmonary vascularity are normal. No adenopathy. No bone lesions. IMPRESSION: New airspace consolidation consistent with pneumonia anterior segment right upper lobe. Persistent mass left upper lobe, not appreciably changed. Areas of patchy airspace consolidation in both lung bases, more on the left than on the right, stable. Underlying interstitial fibrosis in the bases. Stable cardiac silhouette. No adenopathy demonstrable. Electronically Signed   By: Lowella Grip III M.D.   On: 11/02/2016 13:24    EKG: Orders placed or performed during the hospital encounter of 11/02/16  . EKG 12-Lead  . EKG 12-Lead    IMPRESSION AND PLAN: Patient is a 76 year old white male with recent diagnosis of lung cancer presenting with shortness of breath  1. Pneumonia Patient with lung cancer I will go ahead and get a CT scan of the chest to further evaluate Continue IV anabiotic's with Levaquin  2. Acute bronchospasm I will treat with nebulizers steroids and antibiotics  3. Lung cancer I will ask his oncologist to come see him  4. GERD I will place him on PPIs  5. Elevated troponin likely due to demand ischemia we'll follow troponin level  6. Miscellaneous Lovenox for DVT prophylaxis      All the records are reviewed and case discussed with ED provider. Management plans discussed with the patient, family and they are in agreement.  CODE STATUS: Code Status History    Date Active Date Inactive Code Status Order ID Comments User Context   10/19/2016  1:28 PM 10/20/2016  3:17 PM Full Code [id number]  Margaretha Sheffield, MD Inpatient       TOTAL TIME TAKING CARE OF THIS PATIENT: 55 minutes.    Dustin Flock M.D on 11/02/2016 at 3:33 PM  Between 7am to 6pm - Pager -  [phone]  After 6pm go to [email or web address] - password [email or web address] [facility]  Office  [phone]  CC: Primary care physician; Hortencia Pilar, MD

## 2016-11-02 NOTE — Progress Notes (Signed)
  Patient ID: Dean Davidson, male   DOB: [birth year], 76 y.o.   MRN: [id number]  HISTORY: Patient seen for [facility] A Cath placement after extensive evaluation revealed that he was not a surgical candidate.  He was complaining of increasing cough over the last 48 hours along with some sputum production. His oxygen saturations in the office were in the low 80s today. On exam he has bilateral by basilar rales but equal lung sounds. Because of the persistent low oxygen saturations he was transported to the emergency room on an urgent basis. He is scheduled to begin his chemotherapy next week. He came in today to discuss Port-A-Cath placement. He's had some education on that and I again explained to him how this was accomplished. I explained to him the indications and risks of Port-A-Cath placement including risks of bleeding, infection, pneumothorax and death.  Vitals:   Nov 15, 2016 1103  BP: 140/86  Pulse: 96  Temp: 98.3 F (36.8 C)     EXAM:    Resp: Lungs are Equal bilaterally.  Mild respiratory distress, normal effort.  Some peripheral cyanosis Heart:  Regular without murmurs Abd:  Abdomen is soft, non distended and non tender. No masses are palpable.  There is no rebound and no guarding.  Neurological: Alert and oriented to person, place, and time. Coordination normal.  Skin: Skin is warm and dry. No rash noted. No diaphoretic. No erythema. No pallor.  Psychiatric: Normal mood and affect. Normal behavior. Judgment and thought content normal.    ASSESSMENT: I did recommend that he be transported urgently to the emergency room and we transported the patient. Once he arrived in the emergency department they transferred him on oxygen therapy.   PLAN:   We will continue to monitor his progress. We'll place his Port-A-Cath never does clinically suitable. He understands and had all this questions answered.    Nestor Lewandowsky, MD

## 2016-11-02 NOTE — Patient Instructions (Signed)
You are being taken immediately to the Emergency Department due to your Oxygen Saturation of 81%.

## 2016-11-02 NOTE — ED Triage Notes (Signed)
Pt arrived from Dr. Genevive Bi office for room air SpO2 81%. Pt SpO2 increased to 97% on 3L [place]. Pt was being seen for pre-op for placement of port. Pt has history of throat and lung cancer. Pt had surgery to remove tumor from throat on 04/09.

## 2016-11-02 NOTE — Telephone Encounter (Signed)
Patient seen in office this morning to discuss port placement. This was discussed. However, patient's oxygen saturations at time of check in office was 81%. He has been sent to Emergency Department and was admitted with Pneumonia.   Will speak with Dr. Genevive Bi in regards to keeping patient on schedule and doing as inpatient or rescheduling after discharge and recovered.

## 2016-11-03 ENCOUNTER — Encounter: Payer: Self-pay | Admitting: *Deleted

## 2016-11-03 DIAGNOSIS — M069 Rheumatoid arthritis, unspecified: Secondary | ICD-10-CM

## 2016-11-03 DIAGNOSIS — C3412 Malignant neoplasm of upper lobe, left bronchus or lung: Secondary | ICD-10-CM

## 2016-11-03 DIAGNOSIS — Z87891 Personal history of nicotine dependence: Secondary | ICD-10-CM

## 2016-11-03 DIAGNOSIS — Z794 Long term (current) use of insulin: Secondary | ICD-10-CM

## 2016-11-03 DIAGNOSIS — Z79899 Other long term (current) drug therapy: Secondary | ICD-10-CM

## 2016-11-03 DIAGNOSIS — K219 Gastro-esophageal reflux disease without esophagitis: Secondary | ICD-10-CM

## 2016-11-03 DIAGNOSIS — C321 Malignant neoplasm of supraglottis: Secondary | ICD-10-CM

## 2016-11-03 DIAGNOSIS — R531 Weakness: Secondary | ICD-10-CM

## 2016-11-03 DIAGNOSIS — Z8781 Personal history of (healed) traumatic fracture: Secondary | ICD-10-CM

## 2016-11-03 DIAGNOSIS — R5383 Other fatigue: Secondary | ICD-10-CM

## 2016-11-03 DIAGNOSIS — J189 Pneumonia, unspecified organism: Principal | ICD-10-CM

## 2016-11-03 LAB — CBC
HCT: 34.3 % — ABNORMAL LOW (ref 40.0–52.0)
Hemoglobin: 11.8 g/dL — ABNORMAL LOW (ref 13.0–18.0)
MCH: 34.4 pg — AB (ref 26.0–34.0)
MCHC: 34.3 g/dL (ref 32.0–36.0)
MCV: 100.3 fL — AB (ref 80.0–100.0)
Platelets: 360 10*3/uL (ref 150–440)
RBC: 3.42 MIL/uL — AB (ref 4.40–5.90)
RDW: 14.3 % (ref 11.5–14.5)
WBC: 7 10*3/uL (ref 3.8–10.6)

## 2016-11-03 LAB — BASIC METABOLIC PANEL
Anion gap: 7 (ref 5–15)
BUN: 13 mg/dL (ref 6–20)
CALCIUM: 8.2 mg/dL — AB (ref 8.9–10.3)
CHLORIDE: 97 mmol/L — AB (ref 101–111)
CO2: 28 mmol/L (ref 22–32)
CREATININE: 0.66 mg/dL (ref 0.61–1.24)
GFR calc non Af Amer: >60 mL/min (ref >60)
Glucose, Bld: 324 mg/dL — ABNORMAL HIGH (ref 65–99)
Potassium: 4.4 mmol/L (ref 3.5–5.1)
SODIUM: 132 mmol/L — AB (ref 135–145)

## 2016-11-03 LAB — GLUCOSE, CAPILLARY
GLUCOSE-CAPILLARY: 271 mg/dL — AB (ref 65–99)
GLUCOSE-CAPILLARY: 270 mg/dL — AB (ref 65–99)
Glucose-Capillary: 268 mg/dL — ABNORMAL HIGH (ref 65–99)
Glucose-Capillary: 201 mg/dL — ABNORMAL HIGH (ref 65–99)
Glucose-Capillary: 255 mg/dL — ABNORMAL HIGH (ref 65–99)

## 2016-11-03 LAB — TROPONIN I: Troponin I: 0.04 ng/mL (ref <0.03)

## 2016-11-03 MED ORDER — METHYLPREDNISOLONE SODIUM SUCC 125 MG IJ SOLR: 60.0000 mg | INTRAMUSCULAR | Status: DC

## 2016-11-03 MED ORDER — INSULIN ASPART 100 UNIT/ML ~~LOC~~ SOLN
0.0000 [IU] | Freq: Four times a day (QID) | SUBCUTANEOUS | Status: DC
  Administered 2016-11-03 (×3): 5 [IU] via SUBCUTANEOUS
  Administered 2016-11-03: 3 [IU] via SUBCUTANEOUS
  Administered 2016-11-03: 5 [IU] via SUBCUTANEOUS
  Administered 2016-11-04 (×2): 3 [IU] via SUBCUTANEOUS
  Administered 2016-11-04: 09:00:00 2 [IU] via SUBCUTANEOUS
  Administered 2016-11-05: 5 [IU] via SUBCUTANEOUS
  Administered 2016-11-05: 2 [IU] via SUBCUTANEOUS
  Administered 2016-11-05: 7 [IU] via SUBCUTANEOUS
  Administered 2016-11-06: 5 [IU] via SUBCUTANEOUS
  Administered 2016-11-06: 3 [IU] via SUBCUTANEOUS
  Filled 2016-11-03: qty 5
  Filled 2016-11-03 (×3): qty 3
  Filled 2016-11-03: qty 2
  Filled 2016-11-03 (×3): qty 5
  Filled 2016-11-03: qty 3
  Filled 2016-11-03 (×2): qty 5
  Filled 2016-11-03: qty 7
  Filled 2016-11-03: qty 2

## 2016-11-03 MED ORDER — INSULIN ASPART 100 UNIT/ML ~~LOC~~ SOLN
0.0000 [IU] | Freq: Every day | SUBCUTANEOUS | Status: DC
  Administered 2016-11-08: 2 [IU] via SUBCUTANEOUS
  Filled 2016-11-03: qty 2

## 2016-11-03 NOTE — Progress Notes (Signed)
Initial Nutrition Assessment  DOCUMENTATION CODES:   Severe malnutrition in context of acute illness/injury  INTERVENTION:  Recommend Mighty Shake II BID with lunch and dinner, each supplement provides 480-500 kcals and 20-23 grams of protein. Patient prefers chocolate.  Also recommend Magic cup BID with lunch and dinner, each supplement provides 290 kcal and 9 grams of protein.  Reviewed "High-Calorie, High-Protein Nutrition Therapy" from the Academy of Nutrition and Dietetics. Encouraged intake of calorie- and protein-dense foods to prevent further weight loss. Reviewed that foods on this handout would still have to be modified to follow his dysphagia 1 diet with nectar-thick liquids.  Reviewed "National Dysphagia Diet Pureed Nutrition Therapy" and "Thickened Liquid Nutrition Therapy" handouts from the Academy of Nutrition and Dietetics. Encouraged intake of foods from each food group.  NUTRITION DIAGNOSIS:   Malnutrition (Severe) related to acute illness (recent diagnosis lung and epiglottis cancer, dysphagia s/p excision of epiglottis) as evidenced by 6.9 percent weight loss over 2 weeks, moderate depletions of muscle mass, moderate depletion of body fat.  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   -Patient s/p esophagoscopy, bronchoscopy, direct microlaryngoscopy, and excision of epiglottis on 10/19/2016.  -Per chart patient planned to initiate cycle 1 of weekly carboplatinum and Taxol on 11/11/2016. Also may need XRT for SCC of epiglottis. Not a surgical candidate. -On day of  admission patient was supposed to be getting port placed.  Spoke with patient and his wife at bedside. Patient reports he has been on his dysphagia 1 diet with nectar-thick liquids since 4/9 after his surgery. Wife reports they removed 2/3 of his epiglottis. At home patient eats applesauce, cream of wheat with butter and brown sugar, yogurt smoothies, nectar juices, mashed potatoes with gravy and butter blended in, pureed soups, pureed meats, and pureed vegetables. Wife reports he is still eating a well-balanced diet but is eating less than usual due to dislike of the diet. Patient reports his intake will be even lower in the hospital because the pureed food here does not taste as good. Denies any N/V, abdominal pain, constipation/diarrhea at this time.  UBW was 180 lbs. Patient has lost 12.4 lbs (6.9% body weight) over two weeks, which is significant for time frame.  Meal Completion: 75-100% per chart  Medications reviewed and include: Lasix 20 mg daily, Novolog sliding scale Q6hrs and QHS (time between last Q6hr dose and night time dose is only 30 minutes), methylprednisolone 60 mg Q24hrs.  Labs reviewed: CBG 201-271 today, Sodium 132, Chloride 97, elevated Troponin.  Nutrition-Focused physical exam completed. Findings are moderate fat depletion, moderate-severe muscle depletion (legs have severe wasting), and no edema. Patient reports he has difficulty ambulating and when he does he uses a cane.  Diet Order:  DIET - DYS 1 Room service appropriate? Yes; Fluid consistency: Nectar Thick  Skin:  Reviewed, no issues  Last BM:  11/03/2016 - type 2 per chart  Height:   Ht Readings from Last 1 Encounters:  11/02/16 '5\' 11"'$  (1.803 m)    Weight:   Wt Readings from Last 1 Encounters:  11/02/16  167 lb 9.6 oz (76 kg)    Ideal Body Weight:  78.2 kg  BMI:  Body mass index is 23.38 kg/m.  Estimated Nutritional Needs:   Kcal:  2763-9432 (MSJ x 1.3-1.5)  Protein:  90-115 grams (1.2-1.5  grams/kg)  Fluid:  1.9-2.3 L/day (25-30 ml/kg)  EDUCATION NEEDS:   Education needs addressed  Willey Blade, MS, RD, LDN Pager: [phone] After Hours Pager: [phone]

## 2016-11-03 NOTE — Progress Notes (Signed)
Inpatient Diabetes Program Recommendations  AACE/ADA: New Consensus Statement on Inpatient Glycemic Control (2015)  Target Ranges:  Prepandial:   less than 140 mg/dL      Peak postprandial:   less than 180 mg/dL (1-2 hours)      Critically ill patients:  140 - 180 mg/dL  Results for EDINSON, DOMEIER (MRN [id number]) as of 11/03/2016 09:15  Ref. Range 11/03/2016 03:31 11/03/2016 07:27  Glucose-Capillary Latest Ref Range: 65 - 99 mg/dL 268 (H) 201 (H)   Results for CECIL, VANDYKE (MRN [id number]) as of 11/03/2016 09:15  Ref. Range 11/02/2016 12:37 11/03/2016 01:37  Glucose Latest Ref Range: 65 - 99 mg/dL 96 324 (H)   Review of Glycemic Control  Diabetes history: No Outpatient Diabetes medications: NA Current orders for Inpatient glycemic control: Novolog 0-9 units TID with meals, Novolog 0-5 units QHS  Inpatient Diabetes Program Recommendations: Insulin - Basal: While inpatient and ordered steroids, please consider ordering Lantus 7 units Q24H. Please note that if Lantus is ordered as recommended, it will need to be adjusted as steroids are tapered.  Thanks, Barnie Alderman, RN, MSN, CDE Diabetes Coordinator Inpatient Diabetes Program [phone] (Team Pager from 8am to 5pm)

## 2016-11-03 NOTE — Patient Instructions (Signed)
Met with patient while in hospital. Pt is expected to discharge tomorrow. Appointments at cancer center reviewed with patient. Pt was informed that chemo class was changed to 5/3 due to chemo infusion being cancelled on 5/2. Informed pt that chemo infusion will be rescheduled once he is seen by Dr. Grayland Ormond for hospital follow up on 5/2. Pt verbalized understanding. Informed pt to call with any further questions.

## 2016-11-03 NOTE — Consult Note (Signed)
[facility]  [phone] [phone]  ID: Dean Davidson OB: 11-30-1940  MR#: [id number]  BZJ#:[id number]  Patient Care Team: Hortencia Pilar, MD as PCP - General (Family Medicine)  CHIEF COMPLAINT: Worsening shortness of breath, pneumonia. 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  INTERVAL HISTORY: Patient is a 76 year old male with 2 distinct primaries listed above is being evaluated for port placement and noted to have worsening cough, shortness of breath, and decreased oxygen saturations. He was subsequently admitted to the hospital and found to have community-acquired pneumonia. He currently feels improved since admission. He denies any further hemoptysis. He does not complain of any further difficulty swallowing.  His voice is unchanged. He has no neurologic complaints. He has a fair appetite, but denies weight loss. He denies any chest pain. He denies any nausea, vomiting, constipation, or diarrhea. He has no urinary complaints. Patient offers no further specific complaints.  REVIEW OF SYSTEMS:   Review of Systems  Constitutional: Positive for malaise/fatigue. Negative for fever and weight loss.  HENT: Negative.  Negative for sore throat.   Respiratory: Positive for cough and shortness of breath. Negative for hemoptysis.   Cardiovascular: Negative.  Negative for chest pain and leg swelling.  Gastrointestinal: Negative.  Negative for abdominal pain.  Genitourinary: Negative.   Musculoskeletal: Negative.   Skin: Negative.  Negative for rash.  Neurological: Positive for weakness.  Psychiatric/Behavioral: Negative.  The patient is not nervous/anxious.     As per HPI. Otherwise, a complete review of systems is negative.  PAST MEDICAL HISTORY: Past Medical History:  Diagnosis Date  . Arthritis    RHEUMATOID  . Cancer ([place])    SQUAMOUS CELL CARCINOMA LEFT LUNG JUST  DIAGNOSED 10-16-16 BY DR Grayland Ormond PER WIFE  . GERD (gastroesophageal reflux disease)    OCC  . Osteoporosis   . Sacral fracture ([place]) 09/2016    PAST SURGICAL HISTORY: Past Surgical History:  Procedure Laterality Date  . BACK SURGERY  1997   LUMBAR  . COLONOSCOPY    . MICROLARYNGOSCOPY N/A 10/19/2016   Procedure: MICROLARYNGOSCOPY;  Surgeon: Margaretha Sheffield, MD;  Location: [facility] ORS;  Service: ENT;  Laterality: N/A;  . NECK SURGERY    . RIGID BRONCHOSCOPY N/A 10/19/2016   Procedure: RIGID BRONCHOSCOPY;  Surgeon: Margaretha Sheffield, MD;  Location: [facility] ORS;  Service: ENT;  Laterality: N/A;  . RIGID ESOPHAGOSCOPY N/A 10/19/2016   Procedure: RIGID ESOPHAGOSCOPY WITH BIOPSIES OF EPIGLOTTIS;  Surgeon: Margaretha Sheffield, MD;  Location: [facility] OR[facility];  Service: ENT;  Laterality: N/A;    FAMILY HISTORY: Family History  Problem Relation Age of Onset  . Epilepsy Mother   . Cancer Neg Hx     ADVANCED DIRECTIVES (Y/N):  '@ADVDIR'$ @  HEALTH MAINTENANCE: Social History  Substance Use Topics  . Smoking status: Former Smoker    Packs/day: 1.00    Years: 50.00    Types: Cigarettes    Quit date: 07/13/2012  . Smokeless tobacco: Never Used  . Alcohol use No     Colonoscopy:  PAP:  Bone density:  Lipid panel:  No Known Allergies  Current Facility-Administered Medications  Medication Dose Route Frequency Provider Last Rate Last Dose  . 0.9 %  sodium chloride infusion  250 mL Intravenous PRN Dustin Flock, MD      . acetaminophen (TYLENOL) tablet 650 mg  650 mg Oral Q6H PRN Dustin Flock, MD       Or  .  acetaminophen (TYLENOL) suppository 650 mg  650 mg Rectal Q6H PRN Dustin Flock, MD      . amitriptyline (ELAVIL) tablet 50 mg  50 mg Oral QHS Dustin Flock, MD   50 mg at 11/03/16 2123  . calcitonin (salmon) (MIACALCIN/FORTICAL) nasal spray 1 spray  1 spray Alternating Nares QHS Dustin Flock, MD   1 spray at 11/03/16 2124  . chlorhexidine (PERIDEX) 0.12 % solution 15 mL  15 mL Mouth Rinse BID Dustin Flock, MD   15 mL at 11/03/16 2123  . enoxaparin (LOVENOX) injection 40 mg  40 mg Subcutaneous Q24H Dustin Flock, MD   40 mg at 11/03/16 2123  . furosemide (LASIX) tablet 20 mg  20 mg Oral Daily Dustin Flock, MD   20 mg at 11/03/16 0936  . insulin aspart (novoLOG) injection 0-5 Units  0-5 Units Subcutaneous QHS Harrie Foreman, MD      . insulin aspart (novoLOG) injection 0-9 Units  0-9 Units Subcutaneous Q6H Harrie Foreman, MD   5 Units at 11/03/16 2123  . ipratropium-albuterol (DUONEB) 0.5-2.5 (3) MG/3ML nebulizer solution 3 mL  3 mL Nebulization Q6H Dustin Flock, MD   3 mL at 11/03/16 1956  . levofloxacin (LEVAQUIN) IVPB 500 mg  500 mg Intravenous Q24H Dustin Flock, MD   Stopped at 11/03/16 1957  . MEDLINE mouth rinse  15 mL Mouth Rinse q12n4p Dustin Flock, MD   15 mL at 11/03/16 1600  . [START ON 11/07/2016] methotrexate chemo injection 25 mg  25 mg Intramuscular Q Sat Dustin Flock, MD      . Derrill Memo ON 11/04/2016] methylPREDNISolone sodium succinate (SOLU-MEDROL) 125 mg/2 mL injection 60 mg  60 mg Intravenous Q24H Fritzi Mandes, MD      . ondansetron (ZOFRAN) tablet 4 mg  4 mg Oral Q6H PRN Dustin Flock, MD       Or  . ondansetron (ZOFRAN) injection 4 mg  4 mg Intravenous Q6H PRN Dustin Flock, MD      . oxyCODONE (Oxy IR/ROXICODONE) immediate release tablet 15 mg  15 mg Oral Q4H Dustin Flock, MD   15 mg at 11/03/16 2123  . oxyCODONE (Oxy IR/ROXICODONE) immediate release tablet 5 mg  5 mg Oral Q4H PRN Dustin Flock, MD      . prochlorperazine (COMPAZINE) tablet 10 mg  10 mg Oral Q6H PRN Dustin Flock, MD      . sodium chloride flush (NS) 0.9 % injection 3 mL  3 mL Intravenous Q12H Dustin Flock, MD   3 mL at 11/03/16 2128  . sodium chloride flush (NS) 0.9 % injection 3 mL  3 mL Intravenous PRN Dustin Flock, MD        OBJECTIVE: Vitals:   11/03/16 1245 11/03/16 1950  BP: 124/68 134/76  Pulse: 70 96  Resp: 15 20  Temp: 97.9 F (36.6 C) 98.1 F (36.7 C)     Body  mass index is 23.38 kg/m.    ECOG FS:1 - Symptomatic but completely ambulatory  General: Well-developed, well-nourished, no acute distress. Eyes: Pink conjunctiva, anicteric sclera. HEENT: Normocephalic, moist mucous membranes, clear oropharnyx. Lungs: Clear to auscultation bilaterally. Heart: Regular rate and rhythm. No rubs, murmurs, or gallops. Abdomen: Soft, nontender, nondistended. No organomegaly noted, normoactive bowel sounds. Musculoskeletal: No edema, cyanosis, or clubbing. Neuro: Alert, answering all questions appropriately. Cranial nerves grossly intact. Skin: No rashes or petechiae noted. Psych: Normal affect. Lymphatics: No cervical, calvicular, axillary or inguinal LAD.   LAB RESULTS:  Lab Results  Component Value Date  NA 132 (L) 11/03/2016   K 4.4 11/03/2016   CL 97 (L) 11/03/2016   CO2 28 11/03/2016   GLUCOSE 324 (H) 11/03/2016   BUN 13 11/03/2016   CREATININE 0.66 11/03/2016   CALCIUM 8.2 (L) 11/03/2016   PROT 7.2 09/27/2016   ALBUMIN 4.1 09/27/2016   AST 21 09/27/2016   ALT 16 (L) 09/27/2016   ALKPHOS 142 (H) 09/27/2016   BILITOT 0.8 09/27/2016   GFRNONAA >60 11/03/2016   GFRAA >60 11/03/2016    Lab Results  Component Value Date   WBC 7.0 11/03/2016   NEUTROABS 14.2 (H) 11/02/2016   HGB 11.8 (L) 11/03/2016   HCT 34.3 (L) 11/03/2016   MCV 100.3 (H) 11/03/2016   PLT 360 11/03/2016     STUDIES: Dg Chest 2 View  Result Date: 11/02/2016 CLINICAL DATA:  Shortness of breath.  Left-sided lung carcinoma EXAM: CHEST  2 VIEW COMPARISON:  Chest radiograph September 25, 2016 and chest radiograph October 14, 2016 FINDINGS: There is a mass in the anterior segment of the left upper lobe measuring 3.7 x 3.5 x 2.6 cm. There is underlying interstitial fibrosis. There is persistent patchy airspace consolidation in the lung bases bilaterally, more pronounced on the left than on the right. There is new patchy airspace consolidation in the anterior segment of the right  upper lobe. Heart size and pulmonary vascularity are normal. No adenopathy. No bone lesions. IMPRESSION: New airspace consolidation consistent with pneumonia anterior segment right upper lobe. Persistent mass left upper lobe, not appreciably changed. Areas of patchy airspace consolidation in both lung bases, more on the left than on the right, stable. Underlying interstitial fibrosis in the bases. Stable cardiac silhouette. No adenopathy demonstrable. Electronically Signed   By: Lowella Grip III M.D.   On: 11/02/2016 13:24   Ct Chest W Contrast  Result Date: 11/02/2016 CLINICAL DATA:  Squamous cell carcinoma of the left lung, dyspnea and hypoxia. EXAM: CT CHEST WITH CONTRAST TECHNIQUE: Multidetector CT imaging of the chest was performed during intravenous contrast administration. CONTRAST:  18m ISOVUE-300 IOPAMIDOL (ISOVUE-300) INJECTION 61% COMPARISON:  None. FINDINGS: Cardiovascular: Heart size is normal in configuration. Dense three-vessel coronary arteriosclerosis. Mild atherosclerosis of the thoracic aorta and branch vessels without aneurysm nor dissection. Mild retroclavicular extension of the thyroid gland. Mediastinum/Nodes: Stable lymphadenopathy within the mediastinum, the largest is 16 mm short axis, precarinal in position and a 14 mm prevascular lymph node also noted. Smaller lymph nodes are seen along the upper and lower paratracheal portion of the mediastinum as well as right hilum. The esophagus is unremarkable. The trachea is patent. Lungs/Pleura: Spiculated left upper lobe mass measuring 3.8 x 2.9 x 3.8 cm is stable allowing for slight operator dependent imaging differences. No additional pulmonary masses. Heterogeneous coarse interstitial thickening with bronchiectasis and architectural distortion is noted lower lobe predominant. Diffuse centrilobular emphysema is noted with areas of interstitial fibrosis as before. New ground-glass opacities are seen bilaterally in the right upper lobe,  right middle lobe and lingula with chronic stable ground-glass opacities in both lower lobes. Findings may represent sequela of CHF or new developing areas of alveolitis/pneumonitis. Upper Abdomen: No splenomegaly. No adrenal mass. The visualized upper poles of both kidneys are unremarkable. Mild fatty infiltration of the pancreas. Physiologically distended and partially included gallbladder. No space-occupying mass of the visualized portions of the liver are identified. Musculoskeletal: No chest wall abnormality. No acute nor suspicious osseous lesions. IMPRESSION: 1. Stable left upper lobe spiculated mass measuring approximately 3.8 x 2.9 x 3.8  cm. 2. Stable mediastinal lymphadenopathy the largest are prevascular at 14 mm short axis and precarinal at 16 mm short axis. 3. New ground-glass opacities superimposed on centrilobular emphysema and interstitial fibrosis since prior exam involving the right upper lobe, right middle lobe, lingula and both lower lobes. Findings may represent multifocal developing pneumonia, alveolitis/pneumonitis or potentially CHF. Electronically Signed   By: Ashley Royalty M.D.   On: 11/02/2016 23:23   Nm Pet Image Initial (pi) Skull Base To Thigh  Result Date: 10/07/2016 CLINICAL DATA:  Initial treatment strategy for left lung mass. EXAM: NUCLEAR MEDICINE PET SKULL BASE TO THIGH TECHNIQUE: 12.9 mCi F-18 FDG was injected intravenously. Full-ring PET imaging was performed from the skull base to thigh after the radiotracer. CT data was obtained and used for attenuation correction and anatomic localization. FASTING BLOOD GLUCOSE:  Value: 100 mg/dl COMPARISON:  CT on 09/25/16 FINDINGS: NECK No hypermetabolic lymph nodes in the neck. Hypermetabolic soft tissue density is seen involving the epiglottis and left aryepiglottic fold. This measures 1.6 x 2.3 cm in maximum diameter, with SUV max of 12.7. Primary hypopharyngeal/supraglottic carcinoma cannot be excluded. CHEST 3.8 cm hypermetabolic  mass is seen in the anterior left upper lobe with signs of left anterior chest wall invasion. This mass has SUV max of 17.3, and is consistent with primary bronchogenic carcinoma. Hypermetabolic activity is seen in the left inferior pleural space, corresponding with mild diffuse pleural thickening. This is likely reactive in etiology given evidence of chronic bibasilar pulmonary interstitial fibrosis. No focal pleural mass or pleural effusion identified. An area of FDG uptake is also seen corresponding to ill-defined airspace opacity in the posterior right upper lobe which is new since previous study and consistent with infectious or inflammatory etiology. No hypermetabolic lymphadenopathy within the thorax . Aortic and coronary artery atherosclerosis. ABDOMEN/PELVIS No abnormal hypermetabolic activity within the liver, pancreas, adrenal glands, or spleen. No hypermetabolic lymph nodes in the abdomen or pelvis. Aortic atherosclerosis noted. SKELETON No focal hypermetabolic activity to suggest skeletal metastasis. Hypermetabolic activity is seen within the sacral ala bilaterally, consistent with healing sacral insufficiency fractures. IMPRESSION: 3.8 cm hypermetabolic mass in anterior left upper lobe, with left anterior chest wall invasion. This is consistent with primary bronchogenic carcinoma. No evidence of thoracic nodal or distant metastatic disease. 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 New posterior right upper lobe airspace opacity with FDG uptake, consistent with infectious or inflammatory etiology. Hypermetabolic soft tissue density involving the epiglottis and left aryepiglottic fold. Primary hypopharyngeal/supraglottic carcinoma cannot be excluded. Recommend ENT consultation for direct visualization. Healing bilateral sacral insufficiency fractures. Aortic and coronary artery atherosclerosis. Electronically Signed   By:  Earle Gell M.D.   On: 10/07/2016 13:21   Ct Biopsy  Result Date: 10/14/2016 INDICATION: 76 year old with a left lung mass and epiglottis lesion. Tissue diagnosis is needed. EXAM: CT-GUIDED BIOPSY OF LEFT LUNG MASS MEDICATIONS: None. ANESTHESIA/SEDATION: Moderate (conscious) sedation was employed during this procedure. A total of Versed 1.0 mg and Fentanyl 25 mcg was administered intravenously. Moderate Sedation Time: 25 minutes. The patient's level of consciousness and vital signs were monitored continuously by radiology nursing throughout the procedure under my direct supervision. FLUOROSCOPY TIME:  None COMPLICATIONS: None immediate. PROCEDURE: Informed written consent was obtained from the patient after a thorough discussion of the procedural risks, benefits and alternatives. All questions were addressed. Maximal Sterile Barrier Technique was utilized including caps, mask, sterile gowns, sterile gloves, sterile drape, hand hygiene  and skin antiseptic. A timeout was performed prior to the initiation of the procedure. Patient was placed supine on the CT scanner. Images through the upper chest were obtained. The left upper lobe pleural-based lesion was identified. Left upper chest was shaved. Left upper chest was prepped and draped in a sterile fashion. Skin was anesthetized with 1% lidocaine. A 17 gauge coaxial needle was directed into the pleural-based lesion with CT guidance. Two core biopsies obtained with an 18 gauge core device. Specimens placed in formalin. 17 gauge needle was removed using a [employer] tract sealant. Follow up CT images were obtained. Bandage placed over the puncture site. FINDINGS: Pleural-based mass in the left upper lobe measuring up to 3.5 cm. Needle position confirmed within the lesion. Two adequate core biopsies were obtained. No pneumothorax on the post biopsy images. IMPRESSION: Successful CT-guided core biopsy of the left upper lung mass. Electronically Signed   By: Markus Daft  M.D.   On: 10/14/2016 14:02   Dg Chest Port 1 View  Result Date: 10/14/2016 CLINICAL DATA:  Left lung biopsy EXAM: PORTABLE CHEST 1 VIEW COMPARISON:  CT chest 10/14/2016. FINDINGS: 1219 hourschronic interstitial changes again noted. Cardiopericardial silhouette is at upper limits of normal for size. Left upper lobe pulmonary mass evident. No pneumothorax in the left hemithorax. The visualized bony structures of the thorax are intact. Telemetry leads overlie the chest. IMPRESSION: No evidence for left pneumothorax after lung mass biopsy. Electronically Signed   By: Misty Stanley M.D.   On: 10/14/2016 13:10    ASSESSMENT:  Worsening shortness of breath, pneumonia. 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  PLAN:    1. Shortness of breath/pneumonia: Continue current antibiotics as prescribed. Okay to be discharged with oral antibiotics in follow-up in the [facility] as previously scheduled. 2. Port placement: Rescheduled for thoracic surgery. 3. 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 This is likely a separate primary than the squamous cell carcinoma of his epiglottis. He has been evaluated by thoracic surgery and determined that he is not a surgical candidate. A referral was given to radiation oncology to pursue concurrent chemotherapy and XRT. Cycle 1 of weekly carboplatin and Taxol was scheduled to start Nov 11, 2016. Patient has been instructed to keep this as a hospital follow-up appointment and we will likely delay the start of chemotherapy until Nov 18, 2016.  4. Clinical stage I squamous cell carcinoma of the epiglottis: Biopsy completed by ENT confirmed malignancy and likely a second primary. Patient's margins were positive, therefore he will need local treatment with XRT and the concurrent chemotherapy as above.  Appreciate  consult, call with questions.  Cancer Staging Malignant neoplasm of epiglottis ([place]) Staging form: Larynx - Supraglottis, AJCC 8th Edition - Clinical stage from 10/22/2016: Stage I (cT1, cN0, cM0) - Signed by Lloyd Huger, MD on 10/22/2016  Squamous cell carcinoma of left lung [facility]) Staging form: Lung, AJCC 8th Edition - Clinical stage from 10/16/2016: Stage IIA (cT2b, cN0, cM0) - Signed by Lloyd Huger, MD on 10/16/2016   Lloyd Huger, MD   11/03/2016 9:55 PM

## 2016-11-03 NOTE — Progress Notes (Addendum)
[facility] [place] NAME: Dean Davidson    MR#:  [id number]  DATE OF BIRTH:  1940-09-30  SUBJECTIVE:   Patient's breathing became more worse in the nighttime requiring nonrebreather sats dropped into the upper 80s with minimal activity   wife in the room. I checked his sats on 6 L or 88-90% REVIEW OF SYSTEMS:   Review of Systems  Constitutional: Negative for chills, fever and weight loss.  HENT: Negative for ear discharge, ear pain and nosebleeds.   Eyes: Negative for blurred vision, pain and discharge.  Respiratory: Positive for cough. Negative for sputum production, shortness of breath, wheezing and stridor.   Cardiovascular: Negative for chest pain, palpitations, orthopnea and PND.  Gastrointestinal: Negative for abdominal pain, diarrhea, nausea and vomiting.  Genitourinary: Negative for frequency and urgency.  Musculoskeletal: Negative for back pain and joint pain.  Neurological: Positive for weakness. Negative for sensory change, speech change and focal weakness.  Psychiatric/Behavioral: Negative for depression and hallucinations. The patient is not nervous/anxious.    Tolerating Diet:yes Tolerating PT: pending  DRUG ALLERGIES:  No Known Allergies  VITALS:  Blood pressure 124/68, pulse 70, temperature 97.9 F (36.6 C), temperature source Oral, resp. rate 15, height '5\' 11"'$  (1.803 m), weight 76 kg (167 lb 9.6 oz), SpO2 92 %.  PHYSICAL EXAMINATION:   Physical Exam  GENERAL:  76 y.o.-year-old patient lying in the bed with no acute distress.  EYES: Pupils equal, round, reactive to light and accommodation. No scleral icterus. Extraocular muscles intact.  HEENT: Head atraumatic, normocephalic. Oropharynx and nasopharynx clear.  NECK:  Supple, no jugular venous distention. No thyroid enlargement, no tenderness.  LUNGS: coarse breath sounds bilaterally, no wheezing, rales, rhonchi.Mild  use of accessory muscles of  respiration.  CARDIOVASCULAR: S1, S2 normal. No murmurs, rubs, or gallops.  ABDOMEN: Soft, nontender, nondistended. Bowel sounds present. No organomegaly or mass.  EXTREMITIES: No cyanosis, clubbing or edema b/l.    NEUROLOGIC: Cranial nerves II through XII are intact. No focal Motor or sensory deficits b/l.   PSYCHIATRIC:  patient is alert and oriented x 3.  SKIN: No obvious rash, lesion, or ulcer.   LABORATORY PANEL:  CBC  Recent Labs Lab 11/03/16 0137  WBC 7.0  HGB 11.8*  HCT 34.3*  PLT 360    Chemistries   Recent Labs Lab 11/03/16 0137  NA 132*  K 4.4  CL 97*  CO2 28  GLUCOSE 324*  BUN 13  CREATININE 0.66  CALCIUM 8.2*   Cardiac Enzymes  Recent Labs Lab 11/03/16 0137  TROPONINI 0.04*   RADIOLOGY:  Dg Chest 2 View  Result Date: 11/02/2016 CLINICAL DATA:  Shortness of breath.  Left-sided lung carcinoma EXAM: CHEST  2 VIEW COMPARISON:  Chest radiograph September 25, 2016 and chest radiograph October 14, 2016 FINDINGS: There is a mass in the anterior segment of the left upper lobe measuring 3.7 x 3.5 x 2.6 cm. There is underlying interstitial fibrosis. There is persistent patchy airspace consolidation in the lung bases bilaterally, more pronounced on the left than on the right. There is new patchy airspace consolidation in the anterior segment of the right upper lobe. Heart size and pulmonary vascularity are normal. No adenopathy. No bone lesions. IMPRESSION: New airspace consolidation consistent with pneumonia anterior segment right upper lobe. Persistent mass left upper lobe, not appreciably changed. Areas of patchy airspace consolidation in both lung bases, more on the left than on the right, stable. Underlying interstitial fibrosis in  the bases. Stable cardiac silhouette. No adenopathy demonstrable. Electronically Signed   By: Lowella Grip III M.D.   On: 11/02/2016 13:24   Ct Chest W Contrast  Result Date: 11/02/2016 CLINICAL DATA:  Squamous cell carcinoma of the  left lung, dyspnea and hypoxia. EXAM: CT CHEST WITH CONTRAST TECHNIQUE: Multidetector CT imaging of the chest was performed during intravenous contrast administration. CONTRAST:  42m ISOVUE-300 IOPAMIDOL (ISOVUE-300) INJECTION 61% COMPARISON:  None. FINDINGS: Cardiovascular: Heart size is normal in configuration. Dense three-vessel coronary arteriosclerosis. Mild atherosclerosis of the thoracic aorta and branch vessels without aneurysm nor dissection. Mild retroclavicular extension of the thyroid gland. Mediastinum/Nodes: Stable lymphadenopathy within the mediastinum, the largest is 16 mm short axis, precarinal in position and a 14 mm prevascular lymph node also noted. Smaller lymph nodes are seen along the upper and lower paratracheal portion of the mediastinum as well as right hilum. The esophagus is unremarkable. The trachea is patent. Lungs/Pleura: Spiculated left upper lobe mass measuring 3.8 x 2.9 x 3.8 cm is stable allowing for slight operator dependent imaging differences. No additional pulmonary masses. Heterogeneous coarse interstitial thickening with bronchiectasis and architectural distortion is noted lower lobe predominant. Diffuse centrilobular emphysema is noted with areas of interstitial fibrosis as before. New ground-glass opacities are seen bilaterally in the right upper lobe, right middle lobe and lingula with chronic stable ground-glass opacities in both lower lobes. Findings may represent sequela of CHF or new developing areas of alveolitis/pneumonitis. Upper Abdomen: No splenomegaly. No adrenal mass. The visualized upper poles of both kidneys are unremarkable. Mild fatty infiltration of the pancreas. Physiologically distended and partially included gallbladder. No space-occupying mass of the visualized portions of the liver are identified. Musculoskeletal: No chest wall abnormality. No acute nor suspicious osseous lesions. IMPRESSION: 1. Stable left upper lobe spiculated mass measuring  approximately 3.8 x 2.9 x 3.8 cm. 2. Stable mediastinal lymphadenopathy the largest are prevascular at 14 mm short axis and precarinal at 16 mm short axis. 3. New ground-glass opacities superimposed on centrilobular emphysema and interstitial fibrosis since prior exam involving the right upper lobe, right middle lobe, lingula and both lower lobes. Findings may represent multifocal developing pneumonia, alveolitis/pneumonitis or potentially CHF. Electronically Signed   By: DAshley RoyaltyM.D.   On: 11/02/2016 23:23   ASSESSMENT AND PLAN:  RDeen Davidson is a 76y.o. male with a known history of Rheumatoid arthritis, GERD, new diagnosis squamous cell carcinoma of the left lung, osteoporosis as well as recent diagnosis of tumor in the throat who went to see Dr. OFaith Roguefor Port-A-Cath placement to initiate chemotherapy. In the office patient was very short of breath and oxygen was low. chest x-ray showed that he has pneumonia  1. 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 Acute bronchospasmWith Acute COPD exacerbation  with long standing h/o smoking and CT chest showing emphysema/COPD changes will treat with nebulizers, steroids and antibiotics assess for home oxygen need---pt will need oxygen per assessment Pulmonary rehab recommended  patient not requiring significant amount of oxygen to keep his sats around 88-90 he is currently on 6 L had to be placed on nonrebreather. Nighttime. -Spoke with Dr. rJuanell Fairlyfrom pulmonary to see patient. -CODE STATUS re- addressed patient is a full code  3. Lung cancer -seen by dr FGrayland Ormond- recommends port placement as out pt and f/u at cancer center -pt will get his port placed  at a later date.  4. GERD  - on PPIs  5. Elevated troponin likely due to demand ischemia   6. Miscellaneous Lovenox for DVT prophylaxis  PT  HHPT  Consider O2 sats ICU for close monitoring if deemed necessary by pulmonologist  Case discussed with Care Management/Social Worker. Management plans discussed with the patient, family and they are in agreement.  CODE STATUS: Full  DVT Prophylaxis: lovenox  TOTAL TIME TAKING CARE OF THIS PATIENT: *25* minutes.  >50% time spent on counselling and coordination of care  POSSIBLE D/C IN 1-2 DAYS, DEPENDING ON CLINICAL CONDITION.  Note: This dictation was prepared with Dragon dictation along with smaller phrase technology. Any transcriptional errors that result from this process are unintentional.  Roxas Clymer M.D on 11/03/2016 at 7:46 PM  Between 7am to 6pm - Pager - [phone]  After 6pm go to [email or web address] - password [email or web address] [facility]  Office  [phone]  CC: Primary care physician; Hortencia Pilar, MD

## 2016-11-04 LAB — GLUCOSE, CAPILLARY
GLUCOSE-CAPILLARY: 251 mg/dL — AB (ref 65–99)
GLUCOSE-CAPILLARY: 239 mg/dL — AB (ref 65–99)
GLUCOSE-CAPILLARY: 271 mg/dL — AB (ref 65–99)
GLUCOSE-CAPILLARY: 186 mg/dL — AB (ref 65–99)
Glucose-Capillary: 206 mg/dL — ABNORMAL HIGH (ref 65–99)
Glucose-Capillary: 163 mg/dL — ABNORMAL HIGH (ref 65–99)

## 2016-11-04 MED ORDER — GUAIFENESIN 100 MG/5ML PO SOLN: 5.0000 mL | ORAL | Status: DC | PRN

## 2016-11-04 MED ORDER — PREDNISONE 50 MG PO TABS
50.0000 mg | ORAL_TABLET | Freq: Every day | ORAL | Status: DC
  Administered 2016-11-04 – 2016-11-05 (×2): 50 mg via ORAL
  Filled 2016-11-04 (×2): qty 1

## 2016-11-04 MED ORDER — LEVOFLOXACIN 500 MG PO TABS
500.0000 mg | ORAL_TABLET | Freq: Every day | ORAL | Status: DC
  Administered 2016-11-04 – 2016-11-05 (×2): 500 mg via ORAL
  Filled 2016-11-04 (×2): qty 1

## 2016-11-04 MED ORDER — GUAIFENESIN 100 MG/5ML PO SOLN
10.0000 mL | ORAL | Status: DC | PRN
  Administered 2016-11-04: 200 mg via ORAL
  Filled 2016-11-04: qty 10

## 2016-11-04 MED ORDER — MENTHOL 3 MG MT LOZG
1.0000 | LOZENGE | OROMUCOSAL | Status: DC | PRN
  Administered 2016-11-04: 3 mg via ORAL
  Filled 2016-11-04: qty 9

## 2016-11-04 NOTE — Progress Notes (Addendum)
New referral for [facility] home health services of skilled nursing and physical therapy received from [facility]. Mr. 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 He is currently receiving oral antibiotics, scheduled duoneb treatments and  steroids. His pain is treated with oxycodone IR 15 mg q 4 hrs scheduled. Patient lives with his wife [name] who is his primary care giver. Writer met with Mr. Dean Davidson in his room. [facility] services outlined, he remains agreeable. Address and phone number verified.Questions answered. [facility] information and contact number left with Mr. Dean Davidson. Patient information faxed to referral. Patient and hospital care team all aware that [facility] admission will not take place until next week. Plan is for  discharge tomorrow. Patient did have an appointment to have a portacath placed tomorrow 4/16, per chart note review this has been cancelled. Patient information faxed to referral. Will continue to follow through final disposition. Thank you. Flo Shanks RN, BSN, [facility] [place], hospital liaison [phone] c

## 2016-11-04 NOTE — Evaluation (Signed)
Physical Therapy Evaluation Patient Details Name: MATTHEUS RAULS MRN: [id number] DOB: Mar 23, 1941 Today's Date: 11/04/2016   History of Present Illness  Pt is a 76 y/o M who went to see Dr. Faith Rogue for port-a-cath placement to initiate chemotherapy when he became SOB and pt was sent to the ED.  Pt found to have pneumonia. Pt's PMH includes RA, squamous cell carcinoma of L lung, osteoporosis, recent diagnosis of tumor in the throat, sacral fx (09/2016), lumbar surgery, neck surgery.    Clinical Impression  Pt admitted with above diagnosis. Pt currently with functional limitations due to the deficits listed below (see PT Problem List). Mr. Tacy Dura was not O2 dependent PTA but session limited today due to hypoxia. Highest SpO2 is 90% on 4L O2 at rest which decreases to as low as 77% on 4L O2 with supine>sit.  ~5 minutes are required to bring SpO2 up to the 85-90% range while seated EOB.  Again, SpO2 drops as low as 77% on 4L O2 with bed mobility to return to supine and after several minutes of pursed lip breathing SpO2 increases to 88% on 4L O2 with pt in chair position in bed.  PTA pt ambulating short distances in home only due to recent sacral fx.  He was ambulating with quad cane and supervision for safety from his wife.  Anticipate that as pt's pulmonary status improves, his mobility will improve as well.  Wife expresses she is comfortable caring for him at home as she has been doing PTA.  Pending pulmonary progress, he may benefit from Pulmonary Rehab at d/c.  Pt will benefit from skilled PT to increase their independence and safety with mobility to allow discharge to the venue listed below.      Follow Up Recommendations Home health PT    Equipment Recommendations  None recommended by PT    Recommendations for Other Services OT consult;Other (comment) (Pulmonary Rehab)     Precautions / Restrictions Precautions Precautions: Other (comment);Fall Precaution Comments: sacral fx in 09/2016  which pt says was due to prednisone for RA.  Watch O2, was not O2 dependent PTA Restrictions Weight Bearing Restrictions: No      Mobility  Bed Mobility Overal bed mobility: Modified Independent             General bed mobility comments: Increased time and effort but no physical assist or cues needed.   Transfers                 General transfer comment: Unable to assess due to hypoxia  Ambulation/Gait                Stairs            Wheelchair Mobility    Modified Rankin (Stroke Patients Only)       Balance Overall balance assessment: Needs assistance Sitting-balance support: No upper extremity supported;Feet supported Sitting balance-Leahy Scale: Normal Sitting balance - Comments: Pt sits with excellent upright posture without requiring cues.                                       Pertinent Vitals/Pain Pain Assessment: No/denies pain    Home Living Family/patient expects to be discharged to:: Private residence Living Arrangements: Spouse/significant other Available Help at Discharge: Family;Available 24 hours/day Type of Home: House Home Access: Stairs to enter Entrance Stairs-Rails: None Entrance Stairs-Number of Steps: 2 Home Layout: One level  Home Equipment: Latina Craver - 2 wheels      Prior Function Level of Independence: Needs assistance   Gait / Transfers Assistance Needed: Pt ambulating short household distances since sacral fx with quad cane and supervision from wife.  Denies any falls in the past 6 months.    ADL's / Homemaking Assistance Needed: Ind with bathing, dressing.  Wife does the cooking, cleaning, driving.        Hand Dominance        Extremity/Trunk Assessment   Upper Extremity Assessment Upper Extremity Assessment: Overall WFL for tasks assessed    Lower Extremity Assessment Lower Extremity Assessment:  (Strength grossly 4/5 BLEs)    Cervical / Trunk Assessment Cervical /  Trunk Assessment: Normal  Communication   Communication: No difficulties  Cognition Arousal/Alertness: Awake/alert Behavior During Therapy: WFL for tasks assessed/performed Overall Cognitive Status: Within Functional Limits for tasks assessed                                        General Comments General comments (skin integrity, edema, etc.): Highest SpO2 is 90% on 4L O2 at rest which decreases to as low as 77% on 4L O2 with supine>sit.  ~5 minutes are required to bring SpO2 up to the 85-90% range while seated EOB.  Again, SpO2 drops as low as 77% on 4L O2 with bed mobility to return to supine and after several minutes of pursed lip breathing SpO2 increases to 88% on 4L O2 with pt in chair position in bed.    Exercises Other Exercises Other Exercises: Demonstrated and cues provided for pursed lip breathing and encouraged pt to practice this technique throughout the day Other Exercises: Encouraged pt to sit EOB at least a few more times today  Other Exercises: Seated scapular retractions x15 with cues for proper technique   Assessment/Plan    PT Assessment Patient needs continued PT services  PT Problem List Decreased strength;Decreased activity tolerance;Decreased balance;Cardiopulmonary status limiting activity;Decreased safety awareness;Decreased knowledge of use of DME       PT Treatment Interventions DME instruction;Gait training;Stair training;Functional mobility training;Therapeutic activities;Therapeutic exercise;Neuromuscular re-education;Balance training;Patient/family education    PT Goals (Current goals can be found in the Care Plan section)  Acute Rehab PT Goals Patient Stated Goal: to improve breathing and return home PT Goal Formulation: With patient/family Time For Goal Achievement: 11/18/16 Potential to Achieve Goals: Good    Frequency Min 2X/week   Barriers to discharge        Co-evaluation               End of Session Equipment  Utilized During Treatment: Oxygen Activity Tolerance: Treatment limited secondary to medical complications (Comment) (hypoxia) Patient left: in chair;with call bell/phone within reach;with bed alarm set;with family/visitor present;Other (comment) (in chair position) Nurse Communication: Mobility status;Other (comment) (SpO2) PT Visit Diagnosis: Unsteadiness on feet (R26.81);Muscle weakness (generalized) (M62.81)    Time: 1660-6301 PT Time Calculation (min) (ACUTE ONLY): 25 min   Charges:   PT Evaluation $PT Eval Low Complexity: 1 Procedure PT Treatments $Therapeutic Activity: 8-22 mins   PT G Codes:        Collie Siad PT, DPT 11/04/2016, 10:45 AM

## 2016-11-04 NOTE — Progress Notes (Signed)
SATURATION QUALIFICATIONS: (This note is used to comply with regulatory documentation for home oxygen)  Patient Saturations on Room Air at Rest = 82%  Patient Saturations on Room Air while Ambulating = 0%  Patient Saturations on 0 Liters of oxygen while Ambulating =0 %  Please briefly explain why patient needs home oxygen:  Patient at 82% on room air at rest.  Patient at 90% on 6L O2 at rest.

## 2016-11-04 NOTE — Care Management (Signed)
Admitted to this facility with the diagnosis of pneumonia. Lives with wife, Cloyde Reams [phone]). Last seen Dr, Hoy Morn March 12th. Prescriptions are filled at at [facility] in [place]. No home Health. No skilled facility. No home oxygen. Cane and standard walker in the home. No falls. Decreased appetite since March. Lost 14-16 pounds weight. Takes care of all basic activities of daily living himself, doesn't drive. Wife will transport. Physical therapy evaluation completed. Recommending Home Health and Physical therapy in the home. Chose [facility]. Flo Shanks RN representative updated.  May need home oxygen Discharge to home tomorrow per Mr. Tacy Dura. Shelbie Ammons RN MSN CCM Care Management [phone]

## 2016-11-04 NOTE — Telephone Encounter (Signed)
Port Placement has been cancelled per Dr. Genevive Bi as patient is still inpatient at hospital. Will follow-up and rescheduling accordingly.

## 2016-11-04 NOTE — Progress Notes (Signed)
Inpatient Diabetes Program Recommendations  AACE/ADA: New Consensus Statement on Inpatient Glycemic Control (2015)  Target Ranges:  Prepandial:   less than 140 mg/dL      Peak postprandial:   less than 180 mg/dL (1-2 hours)      Critically ill patients:  140 - 180 mg/dL   Results for Dean Davidson, Dean Davidson (MRN [id number]) as of 11/04/2016 11:41  Ref. Range 11/03/2016 07:27 11/03/2016 11:27 11/03/2016 16:53 11/03/2016 20:08 11/04/2016 03:27 11/04/2016 08:00 11/04/2016 11:32  Glucose-Capillary Latest Ref Range: 65 - 99 mg/dL 201 (H) 255 (H) 271 (H) 270 (H) 206 (H) 163 (H) 251 (H)   Review of Glycemic Control  Diabetes history: No Outpatient Diabetes medications: NA Current orders for Inpatient glycemic control: Novolog 0-9 units TID with meals, Novolog 0-5 units QHS  Inpatient Diabetes Program Recommendations:  Insulin - Meal Coverage: Note steroids decreased and changed to PO and post prandial glucose is consistently elevated. If steroids are continued, please consider ordering Novolog 3 units TID with meals for meal coverage if patient eats at least 50% of meals.  Thanks, Barnie Alderman, RN, MSN, CDE Diabetes Coordinator Inpatient Diabetes Program [phone] (Team Pager from 8am to 5pm)

## 2016-11-05 ENCOUNTER — Encounter: Admission: RE | Payer: Self-pay | Source: Ambulatory Visit

## 2016-11-05 ENCOUNTER — Institutional Professional Consult (permissible substitution): Payer: Medicare Other | Admitting: Radiation Oncology

## 2016-11-05 ENCOUNTER — Ambulatory Visit: Admission: RE | Admit: 2016-11-05 | Payer: Medicare Other | Source: Ambulatory Visit | Admitting: Cardiothoracic Surgery

## 2016-11-05 ENCOUNTER — Inpatient Hospital Stay: Payer: Medicare Other

## 2016-11-05 DIAGNOSIS — J181 Lobar pneumonia, unspecified organism: Secondary | ICD-10-CM

## 2016-11-05 DIAGNOSIS — J8410 Pulmonary fibrosis, unspecified: Secondary | ICD-10-CM

## 2016-11-05 LAB — GLUCOSE, CAPILLARY
GLUCOSE-CAPILLARY: 110 mg/dL — AB (ref 65–99)
Glucose-Capillary: 154 mg/dL — ABNORMAL HIGH (ref 65–99)
Glucose-Capillary: 260 mg/dL — ABNORMAL HIGH (ref 65–99)
Glucose-Capillary: 302 mg/dL — ABNORMAL HIGH (ref 65–99)

## 2016-11-05 LAB — PROCALCITONIN: Procalcitonin: 0.13 ng/mL

## 2016-11-05 SURGERY — INSERTION, TUNNELED CENTRAL VENOUS DEVICE, WITH PORT
Anesthesia: Choice

## 2016-11-05 MED ORDER — FUROSEMIDE 10 MG/ML IJ SOLN
40.0000 mg | Freq: Once | INTRAMUSCULAR | Status: AC
  Administered 2016-11-05: 40 mg via INTRAVENOUS
  Filled 2016-11-05: qty 4

## 2016-11-05 MED ORDER — METHYLPREDNISOLONE SODIUM SUCC 125 MG IJ SOLR
60.0000 mg | Freq: Four times a day (QID) | INTRAMUSCULAR | Status: DC
  Administered 2016-11-05 – 2016-11-08 (×11): 60 mg via INTRAVENOUS
  Filled 2016-11-05 (×11): qty 2

## 2016-11-05 MED ORDER — DOCUSATE SODIUM 100 MG PO CAPS
100.0000 mg | ORAL_CAPSULE | Freq: Two times a day (BID) | ORAL | Status: DC
  Administered 2016-11-05 – 2016-11-09 (×7): 100 mg via ORAL
  Filled 2016-11-05 (×8): qty 1

## 2016-11-05 NOTE — Progress Notes (Signed)
Called to room per pts wife.  Pt having some resp difficulty. resp 28.  On 6 l [place]. 02 sats on this now 77-78%.  venti mask placed  55%.   Pt s sats returned to 95-96 % on the mask. Pt in chair at this time now for 1.5 hrs. tol well. Pt diuresing from lasix iv given. sats tend to drop  With exertion. resp heather notified of this and dr patel informed.

## 2016-11-05 NOTE — Progress Notes (Signed)
Pt  Was on 6 liters 02  Earlier  sats dropped and resp  Put  Him on nonrebreather  At 55 %  Pt had been up to bsc.  Dr patel saw pt and  Pt   Back on 6 l [place] 02. Getting up to chair at present time. Wife at bedside

## 2016-11-05 NOTE — Progress Notes (Signed)
Pt moved to rm 104 to be closer to desk. 0n  6 l [place] now 89-93% on this.  Has had  venti mask today.  Dr ram saw on consult this pm and  Placed on droplet precautions and resp panel by pcr ordered and sent awaiting results. Pt started back on solumedrol. Pain  meds cont.  meds in applesauce.

## 2016-11-05 NOTE — Consult Note (Signed)
NEW PATIENT EVALUATION  Name: Dean Davidson  MRN: [id number]  Date:   11/02/2016     DOB: Nov 22, 1940   This 76 y.o. 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  REFERRING PHYSICIAN: No ref. provider found  CHIEF COMPLAINT:  Chief Complaint  Patient presents with  . Low oxygen saturation  . Shortness of Breath    DIAGNOSIS: The primary encounter diagnosis was HCAP (healthcare-associated pneumonia). Diagnoses of SOB (shortness of breath), Hypoxia, and Lung cancer ([place]) were also pertinent to this visit.   PREVIOUS INVESTIGATIONS:  PET CT and CT scans reviewed Clinical notes reviewed Pathology reports reviewed  HPI: Patient is a 76 year old male who is seen in the hospital admitted for worsening shortness of breath and pneumonia. He originally presented with dysphagia and hemoptysis which prompted a chest x-ray as well as CT scans. He was found to have a left upper lobe mass measuring about 4 cm in greatest dimension. He did also have interstitial fibrosis with a basilar predominance. PET CT scan demonstrated a 3.8 cm hypermetabolic mass in the anterior left upper lobe with left anterior chest wall invasion. No evidence of mediastinal adenopathy or hilar adenopathy being hypermetabolic was seen. He also is noted to have hypermetabolic soft tissue density involving the epiglottis and left aryepiglottic fold. He underwent CT-guided biopsy of the left upper lobe mass which was positive for squamous cell carcinoma. He also on examination by ENT with biopsy of his epiglottic mass which is again positive for invasive squamous cell carcinoma with basaloid features. Carcinoma did invade the cartilage. He's been seen by medical oncology is now referred to radiation oncology  for consideration of treatment. He is currently on nasal oxygen in the hospital bed. He is currently receiving antibiotic therapy and is slowly improving. He specifically denies any recent episode of hemoptysis. He's having no chest wall pain at this time. He is having no head and neck pain or dysphagia.  PLANNED TREATMENT REGIMEN: SB RT to his left upper lobe lung cancer and concurrent chemoradiation for his head and neck cancer.  PAST MEDICAL HISTORY:  has a past medical history of Arthritis; Cancer [facility]); GERD (gastroesophageal reflux disease); Osteoporosis; and Sacral fracture ([place]) (09/2016).    PAST SURGICAL HISTORY:  Past Surgical History:  Procedure Laterality Date  . BACK SURGERY  1997   LUMBAR  . COLONOSCOPY    . MICROLARYNGOSCOPY N/A 10/19/2016   Procedure: MICROLARYNGOSCOPY;  Surgeon: Margaretha Sheffield, MD;  Location: [facility] ORS;  Service: ENT;  Laterality: N/A;  . NECK SURGERY    . RIGID BRONCHOSCOPY N/A 10/19/2016   Procedure: RIGID BRONCHOSCOPY;  Surgeon: Margaretha Sheffield, MD;  Location: [facility] ORS;  Service: ENT;  Laterality: N/A;  . RIGID ESOPHAGOSCOPY N/A 10/19/2016   Procedure: RIGID ESOPHAGOSCOPY WITH BIOPSIES OF EPIGLOTTIS;  Surgeon: Margaretha Sheffield, MD;  Location: [facility] ORS;  Service: ENT;  Laterality: N/A;    FAMILY HISTORY: family history includes Epilepsy in his mother.  SOCIAL HISTORY:  reports that he quit smoking about 4 years ago. His smoking use included Cigarettes. He has a 50.00 pack-year smoking history. He has never used smokeless tobacco. He reports that he does not drink alcohol or use drugs.  ALLERGIES: Patient has no known allergies.  MEDICATIONS:  Current Facility-Administered Medications  Medication Dose  Route Frequency Provider Last Rate Last Dose  . 0.9 %  sodium chloride infusion  250 mL Intravenous PRN Dustin Flock, MD      . acetaminophen (TYLENOL) tablet 650 mg  650 mg Oral Q6H PRN Dustin Flock, MD       Or  . acetaminophen (TYLENOL) suppository 650 mg  650  mg Rectal Q6H PRN Dustin Flock, MD      . amitriptyline (ELAVIL) tablet 50 mg  50 mg Oral QHS Dustin Flock, MD   50 mg at 11/04/16 2232  . calcitonin (salmon) (MIACALCIN/FORTICAL) nasal spray 1 spray  1 spray Alternating Nares QHS Dustin Flock, MD   1 spray at 11/04/16 2231  . chlorhexidine (PERIDEX) 0.12 % solution 15 mL  15 mL Mouth Rinse BID Dustin Flock, MD   15 mL at 11/05/16 0832  . enoxaparin (LOVENOX) injection 40 mg  40 mg Subcutaneous Q24H Dustin Flock, MD   40 mg at 11/04/16 2233  . guaiFENesin (ROBITUSSIN) 100 MG/5ML solution 200 mg  10 mL Oral Q4H PRN Fritzi Mandes, MD   200 mg at 11/04/16 1215  . insulin aspart (novoLOG) injection 0-5 Units  0-5 Units Subcutaneous QHS Harrie Foreman, MD      . insulin aspart (novoLOG) injection 0-9 Units  0-9 Units Subcutaneous Q6H Harrie Foreman, MD   2 Units at 11/05/16 [phone]  . ipratropium-albuterol (DUONEB) 0.5-2.5 (3) MG/3ML nebulizer solution 3 mL  3 mL Nebulization Q6H Dustin Flock, MD   3 mL at 11/05/16 0751  . levofloxacin (LEVAQUIN) tablet 500 mg  500 mg Oral Daily Fritzi Mandes, MD   500 mg at 11/04/16 1802  . MEDLINE mouth rinse  15 mL Mouth Rinse q12n4p Dustin Flock, MD   15 mL at 11/04/16 1550  . menthol-cetylpyridinium (CEPACOL) lozenge 3 mg  1 lozenge Oral PRN Fritzi Mandes, MD   3 mg at 11/04/16 1110  . [START ON 11/07/2016] methotrexate chemo injection 25 mg  25 mg Intramuscular Q Sat Shreyang Patel, MD      . ondansetron (ZOFRAN) tablet 4 mg  4 mg Oral Q6H PRN Dustin Flock, MD       Or  . ondansetron (ZOFRAN) injection 4 mg  4 mg Intravenous Q6H PRN Dustin Flock, MD      . oxyCODONE (Oxy IR/ROXICODONE) immediate release tablet 15 mg  15 mg Oral Q4H Dustin Flock, MD   15 mg at 11/05/16 0938  . predniSONE (DELTASONE) tablet 50 mg  50 mg Oral Q breakfast Fritzi Mandes, MD   50 mg at 11/05/16 1829  . prochlorperazine (COMPAZINE) tablet 10 mg  10 mg Oral Q6H PRN Dustin Flock, MD      . sodium chloride flush (NS) 0.9 %  injection 3 mL  3 mL Intravenous Q12H Dustin Flock, MD   3 mL at 11/05/16 1057  . sodium chloride flush (NS) 0.9 % injection 3 mL  3 mL Intravenous PRN Dustin Flock, MD        ECOG PERFORMANCE STATUS:  2 - Symptomatic, <50% confined to bed  REVIEW OF SYSTEMS: Except for the prior hemoptysis cough weight loss  Patient denies any weight loss, fatigue, weakness, fever, chills or night sweats. Patient denies any loss of vision, blurred vision. Patient denies any ringing  of the ears or hearing loss. No irregular heartbeat. Patient denies heart murmur or history of fainting. Patient denies any chest pain or pain radiating to her upper extremities. Patient denies any shortness of breath, difficulty breathing at  night, cough or hemoptysis. Patient denies any swelling in the lower legs. Patient denies any nausea vomiting, vomiting of blood, or coffee ground material in the vomitus. Patient denies any stomach pain. Patient states has had normal bowel movements no significant constipation or diarrhea. Patient denies any dysuria, hematuria or significant nocturia. Patient denies any problems walking, swelling in the joints or loss of balance. Patient denies any skin changes, loss of hair or loss of weight. Patient denies any excessive worrying or anxiety or significant depression. Patient denies any problems with insomnia. Patient denies excessive thirst, polyuria, polydipsia. Patient denies any swollen glands, patient denies easy bruising or easy bleeding. Patient denies any recent infections, allergies or URI. Patient "s visual fields have not changed significantly in recent time.    PHYSICAL EXAM: BP 138/62   Pulse 72   Temp 98.2 F (36.8 C) (Oral)   Resp (!) 26   Ht '5\' 11"'$  (1.803 m)   Wt 167 lb 9.6 oz (76 kg)   SpO2 96%   BMI 23.38 kg/m  Well-developed somewhat frail male in NAD. He is on nasal oxygen. Well-developed well-nourished patient in NAD. HEENT reveals PERLA, EOMI, discs not visualized.   Oral cavity is clear. No oral mucosal lesions are identified. Neck is clear without evidence of cervical or supraclavicular adenopathy. Lungs are clear to A&P. Cardiac examination is essentially unremarkable with regular rate and rhythm without murmur rub or thrill. Abdomen is benign with no organomegaly or masses noted. Motor sensory and DTR levels are equal and symmetric in the upper and lower extremities. Cranial nerves II through XII are grossly intact. Proprioception is intact. No peripheral adenopathy or edema is identified. No motor or sensory levels are noted. Crude visual fields are within normal range.  LABORATORY DATA: Pathology reports reviewed    RADIOLOGY RESULTS: CT scans and PET/CT scan reviewed   IMPRESSION: Stage I squamous cell carcinoma of the left upper lobe in 76 year old male with stage IIIa (T3 N0 M0) squamous cell carcinoma the supraglottic larynx in 76 year old male  PLAN: I did discuss the case personally with medical oncology think I can use SB RT on his left upper lobe lesion and treat 5000 cGy in 5 fractions. I personally set up and ordered CT simulation for next week. Risks and benefits of SB RT including increased chance of cough fatigue possible chest wall pain alteration of blood counts all were discussed in detail. After completion of SB RT would reevaluate the patient for radiation therapy for his stage III epiglottic cancer. I will plan on going to 7000 cGy to his area of primary tumor involvement treating his neck nodes to another 5400 cGy using I MRT dose painting technique. Risks and benefits of that were briefly reviewed and I will review that after completion of his lung treatment. I have personally set up and ordered CT simulation for next week with motion study. Patient wife comprehend my treatment plan well. We will coronary care with medical oncology.  I would like to take this opportunity to thank you for allowing me to participate in the care of your  patient.Armstead Peaks., MD

## 2016-11-05 NOTE — Care Management Important Message (Signed)
Important Message  Patient Details  Name: Dean Davidson MRN: [id number] Date of Birth: Jan 09, 1941   Medicare Important Message Given:  Yes    Shelbie Ammons, RN 11/05/2016, 8:07 AM

## 2016-11-05 NOTE — Consult Note (Signed)
[facility] Consultation      Assessment and Plan:  The patient is a 76 yo male with RA, pulmonary fibrosis, recent diagnosis of NSCLC, not yet started on chemo/RT now with acute respiratory failure.   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 Recommend stop methotrexate if possible. This appears to have advanced significant compared to scans taken in 2014.  --Possible IPF exacerbation, will rx with high dose steroids.  --If not improved in next few days may require bronchoscopy.  --Outpatient follow up with pulmonary, will require PFT.   Pneumonitis --RUL/RML; infectious vs. IPF exacerbation vs. Metastatic cancer.  --Check respiratory viral panel.  --High dose steroids.  --Agree with continue empiric abx, will check procalcitonin.   Lung cancer.  --Treatment pending resolution of current respiratory issues.   Date: 11/05/2016  MRN# [id number] Dean Davidson 1940-11-22  Referring Physician: Dr. Posey Pronto for dyspnea.   Dean Davidson is a 76 y.o. old male seen in consultation for chief complaint of:    Chief Complaint  Patient presents with  . Low oxygen saturation  . Shortness of Breath    HPI:   The patient is a 76 yo male with history of hemoptysis, this led do discovery of a LUL lung mass, he had a CT biopsy on 10/14/16 which showed squamous cell ca.PET scan showed uptake in epiglottis, therefore he underwent biopsy of this lesion as well.  He saw Dr. Grayland Ormond and was diagnosed with stage 2a Sq cell LC, as well as Stage 1 Sq cell ca of the epiglottis, felt to be 2 separate primaries. He was seen by Dr. Genevive Bi, but felt that surgical approach was not recommended.  The patient has a known history of RA. He was referred to start chemo and radiation. He was seen by Dr. Genevive Bi again on 4/23 to discuss port placement, however he was very dyspneic and had to be taken to the ER as his sat was  in the 80's on RA.   In the ED and since admission he was started on 3L [place], on 4/25 his oxygen was increased to 6L. He was placed on Venti mask today briefly at 50%, then weaned back to 6L. He has been started on levaquin and prednisone at 50 mg daily.   Pt tells me that at baseline he is very active, walking his dogs and doing work around the house, however about 4 weeks ago he started to notice that his breathing was going downhill. The became particularly worse after the lung biopsy.   On my personal review of her CT chest imaging 4/23; there is severe bi-basilar fibrosis, left greater than right, and lingula. There is also fine ground glass/reticular changes in the RUL/RML which were not seen in recent CT biopsy films on 4/4. The previously seen LUL lung mass is still present.  When compared with previous film from 09/25/16; the RML/RUL changes are completely new, and the bibasilar fibrotic changes have advanced with an increased proximal component in both lungs.   PMHX:   Past Medical History:  Diagnosis Date  . Arthritis    RHEUMATOID  . Cancer ([place])    SQUAMOUS CELL CARCINOMA LEFT LUNG JUST DIAGNOSED 10-16-16 BY DR Grayland Ormond PER WIFE  . GERD (gastroesophageal reflux disease)    OCC  . Osteoporosis   . Sacral fracture ([place]) 09/2016   Surgical Hx:  Past Surgical History:  Procedure Laterality Date  . BACK SURGERY  1997   LUMBAR  . COLONOSCOPY    . MICROLARYNGOSCOPY N/A 10/19/2016   Procedure: MICROLARYNGOSCOPY;  Surgeon: Margaretha Sheffield, MD;  Location: [facility] ORS;  Service: ENT;  Laterality: N/A;  . NECK SURGERY    . RIGID BRONCHOSCOPY N/A 10/19/2016   Procedure: RIGID BRONCHOSCOPY;  Surgeon: Margaretha Sheffield, MD;  Location: [facility] ORS;  Service: ENT;  Laterality: N/A;  . RIGID ESOPHAGOSCOPY N/A 10/19/2016   Procedure: RIGID ESOPHAGOSCOPY WITH BIOPSIES OF EPIGLOTTIS;  Surgeon: Margaretha Sheffield, MD;  Location: [facility] ORS;  Service: ENT;  Laterality: N/A;   Family Hx:  Family History  Problem Relation  Age of Onset  . Epilepsy Mother   . Cancer Neg Hx    Social Hx:   Social History  Substance Use Topics  . Smoking status: Former Smoker    Packs/day: 1.00    Years: 50.00    Types: Cigarettes    Quit date: 07/13/2012  . Smokeless tobacco: Never Used  . Alcohol use No   Medication:   Reviewed.    Allergies:  Patient has no known allergies.  Review of Systems: Gen:  Denies  fever, sweats, chills HEENT: Denies blurred vision, double vision. bleeds, sore throat Cvc:  No dizziness, chest pain. Resp:   Denies cough or sputum production, shortness of breath Gi: Denies swallowing difficulty, stomach pain. Gu:  Denies bladder incontinence, burning urine Ext:   No Joint pain, stiffness. Skin: No skin rash,  hives  Endoc:  No polyuria, polydipsia. Psych: No depression, insomnia. Other:  All other systems were reviewed with the patient and were negative other that what is mentioned in the HPI.   Physical Examination:   VS: BP 127/77 (BP Location: Right Arm)   Pulse (!) 107   Temp 97.9 F (36.6 C) (Oral)   Resp (!) 26   Ht '5\' 11"'$  (1.803 m)   Wt 167 lb 9.6 oz (76 kg)   SpO2 93%   BMI 23.38 kg/m   General Appearance: No distress  Neuro:without focal findings,  speech normal,  HEENT: PERRLA, EOM intact.   Pulmonary: bibasilar inspiratory crackles, No wheezing.  CardiovascularNormal S1,S2.  No m/r/g.   Abdomen: Benign, Soft, non-tender. Renal:  No costovertebral tenderness  GU:  No performed at this time. Endoc: No evident thyromegaly, no signs of acromegaly. Skin:   warm, no rashes, no ecchymosis  Extremities: normal, no cyanosis, clubbing.  Other findings:    LABORATORY PANEL:   CBC  Recent Labs Lab 11/03/16 0137  WBC 7.0  HGB 11.8*  HCT 34.3*  PLT 360   ------------------------------------------------------------------------------------------------------------------  Chemistries   Recent Labs Lab 11/03/16 0137  NA 132*  K 4.4  CL 97*  CO2 28    GLUCOSE 324*  BUN 13  CREATININE 0.66  CALCIUM 8.2*   ------------------------------------------------------------------------------------------------------------------  Cardiac Enzymes  Recent Labs Lab 11/03/16 0137  TROPONINI 0.04*   ------------------------------------------------------------  RADIOLOGY:  No results found.     Thank  you for the consultation and for allowing [facility] to assist in the care of your patient. Our recommendations are noted above.  Please contact us if we can be of further service.   Marda Stalker, MD.  Board Certified in Internal Medicine, Pulmonary Medicine, [place], and Sleep Medicine.  [facility] Office Number: [phone]  Patricia Pesa, M.D.  Merton Border, M.D  11/05/2016

## 2016-11-06 ENCOUNTER — Inpatient Hospital Stay: Payer: Medicare Other

## 2016-11-06 ENCOUNTER — Ambulatory Visit: Payer: Medicare Other | Admitting: Cardiothoracic Surgery

## 2016-11-06 DIAGNOSIS — J189 Pneumonia, unspecified organism: Secondary | ICD-10-CM

## 2016-11-06 DIAGNOSIS — Z66 Do not resuscitate: Secondary | ICD-10-CM

## 2016-11-06 DIAGNOSIS — Z515 Encounter for palliative care: Secondary | ICD-10-CM

## 2016-11-06 DIAGNOSIS — Z7189 Other specified counseling: Secondary | ICD-10-CM

## 2016-11-06 DIAGNOSIS — R0902 Hypoxemia: Secondary | ICD-10-CM

## 2016-11-06 DIAGNOSIS — C3492 Malignant neoplasm of unspecified part of left bronchus or lung: Secondary | ICD-10-CM

## 2016-11-06 LAB — GLUCOSE, CAPILLARY
GLUCOSE-CAPILLARY: 268 mg/dL — AB (ref 65–99)
GLUCOSE-CAPILLARY: 203 mg/dL — AB (ref 65–99)
GLUCOSE-CAPILLARY: 322 mg/dL — AB (ref 65–99)
GLUCOSE-CAPILLARY: 176 mg/dL — AB (ref 65–99)
Glucose-Capillary: 296 mg/dL — ABNORMAL HIGH (ref 65–99)

## 2016-11-06 LAB — RESPIRATORY PANEL BY PCR
Adenovirus: NOT DETECTED
BORDETELLA PERTUSSIS-RVPCR: NOT DETECTED
CHLAMYDOPHILA PNEUMONIAE-RVPPCR: NOT DETECTED
Coronavirus 229E: NOT DETECTED
Coronavirus HKU1: NOT DETECTED
Coronavirus NL63: NOT DETECTED
Coronavirus OC43: NOT DETECTED
INFLUENZA B-RVPPCR: NOT DETECTED
Influenza A: NOT DETECTED
MYCOPLASMA PNEUMONIAE-RVPPCR: NOT DETECTED
Metapneumovirus: NOT DETECTED
PARAINFLUENZA VIRUS 3-RVPPCR: NOT DETECTED
PARAINFLUENZA VIRUS 4-RVPPCR: NOT DETECTED
Parainfluenza Virus 1: NOT DETECTED
Parainfluenza Virus 2: NOT DETECTED
RHINOVIRUS / ENTEROVIRUS - RVPPCR: NOT DETECTED
Respiratory Syncytial Virus: NOT DETECTED

## 2016-11-06 MED ORDER — INSULIN ASPART 100 UNIT/ML ~~LOC~~ SOLN
0.0000 [IU] | Freq: Three times a day (TID) | SUBCUTANEOUS | Status: DC
Start: 2016-11-06 — End: 2016-11-10
  Administered 2016-11-06 – 2016-11-07 (×3): 5 [IU] via SUBCUTANEOUS
  Administered 2016-11-07: 18:00:00 2 [IU] via SUBCUTANEOUS
  Administered 2016-11-08 (×2): 5 [IU] via SUBCUTANEOUS
  Administered 2016-11-09: 08:00:00 2 [IU] via SUBCUTANEOUS
  Administered 2016-11-09: 3 [IU] via SUBCUTANEOUS
  Filled 2016-11-06: qty 2
  Filled 2016-11-06: qty 5
  Filled 2016-11-06: qty 3
  Filled 2016-11-06 (×4): qty 5
  Filled 2016-11-06: qty 2

## 2016-11-06 MED ORDER — MORPHINE SULFATE 10 MG/5ML PO SOLN
5.0000 mg | ORAL | Status: DC | PRN
  Administered 2016-11-06 – 2016-11-09 (×7): 5 mg via ORAL
  Filled 2016-11-06 (×7): qty 5

## 2016-11-06 MED ORDER — LEVOFLOXACIN 500 MG PO TABS
500.0000 mg | ORAL_TABLET | Freq: Every day | ORAL | Status: AC
  Administered 2016-11-06 – 2016-11-08 (×3): 500 mg via ORAL
  Filled 2016-11-06 (×3): qty 1

## 2016-11-06 MED ORDER — ALPRAZOLAM 0.5 MG PO TABS
0.2500 mg | ORAL_TABLET | Freq: Two times a day (BID) | ORAL | Status: DC | PRN
Start: 2016-11-06 — End: 2016-11-08
  Administered 2016-11-07 – 2016-11-08 (×2): 0.25 mg via ORAL
  Filled 2016-11-06 (×2): qty 1

## 2016-11-06 MED ORDER — INSULIN GLARGINE 100 UNIT/ML ~~LOC~~ SOLN
5.0000 [IU] | Freq: Every day | SUBCUTANEOUS | Status: DC
  Administered 2016-11-06 – 2016-11-09 (×4): 5 [IU] via SUBCUTANEOUS
  Filled 2016-11-06 (×5): qty 0.05

## 2016-11-06 NOTE — Progress Notes (Signed)
[facility] NAME: Dean Davidson    MR#:  [id number]  DATE OF BIRTH:  [birth year]  SUBJECTIVE:   Patient's breathing became more worse in the nighttime requiring nonrebreather sats dropped into the upper 80s with minimal activity   wife in the room. I checked his sats on 6 L or 88-90% REVIEW OF SYSTEMS:   Review of Systems  Constitutional: Negative for chills, fever and weight loss.  HENT: Negative for ear discharge, ear pain and nosebleeds.   Eyes: Negative for blurred vision, pain and discharge.  Respiratory: Positive for cough. Negative for sputum production, shortness of breath, wheezing and stridor.   Cardiovascular: Negative for chest pain, palpitations, orthopnea and PND.  Gastrointestinal: Negative for abdominal pain, diarrhea, nausea and vomiting.  Genitourinary: Negative for frequency and urgency.  Musculoskeletal: Negative for back pain and joint pain.  Neurological: Positive for weakness. Negative for sensory change, speech change and focal weakness.  Psychiatric/Behavioral: Negative for depression and hallucinations. The patient is not nervous/anxious.    Tolerating Diet:yes Tolerating PT: pending  DRUG ALLERGIES:  No Known Allergies  VITALS:  Blood pressure 117/69, pulse 75, temperature 97.7 F (36.5 C), temperature source Oral, resp. rate 19, height '5\' 11"'$  (1.803 m), weight 76 kg (167 lb 9.6 oz), SpO2 92 %.  PHYSICAL EXAMINATION:   Physical Exam  GENERAL:  76 y.o.-year-old patient lying in the bed with no acute distress. chronically EYES: Pupils equal, round, reactive to light and accommodation. No scleral icterus. Extraocular muscles intact.  HEENT: Head atraumatic, normocephalic. Oropharynx and nasopharynx clear.  NECK:  Supple, no jugular venous distention. No thyroid enlargement, no tenderness.  LUNGS: coarse breath sounds bilaterally, no wheezing, rales, rhonchi.Mild  use of accessory muscles of  respiration.  CARDIOVASCULAR: S1, S2 normal. No murmurs, rubs, or gallops.  ABDOMEN: Soft, nontender, nondistended. Bowel sounds present. No organomegaly or mass.  EXTREMITIES: No cyanosis, clubbing or edema b/l.    NEUROLOGIC: Cranial nerves II through XII are intact. No focal Motor or sensory deficits b/l.   PSYCHIATRIC:  patient is alert and oriented x 3.  SKIN: No obvious rash, lesion, or ulcer.   LABORATORY PANEL:  CBC  Recent Labs Lab 11/03/16 0137  WBC 7.0  HGB 11.8*  HCT 34.3*  PLT 360    Chemistries   Recent Labs Lab 11/03/16 0137  NA 132*  K 4.4  CL 97*  CO2 28  GLUCOSE 324*  BUN 13  CREATININE 0.66  CALCIUM 8.2*   Cardiac Enzymes  Recent Labs Lab 11/03/16 0137  TROPONINI 0.04*   RADIOLOGY:  No results found. ASSESSMENT AND PLAN:  Dean Davidson  is a 76 y.o. male with a known history of Rheumatoid arthritis, GERD, new diagnosis squamous cell carcinoma of the left lung, osteoporosis as well as recent diagnosis of tumor in the throat who went to see Dr. Faith Rogue for Port-A-Cath placement to initiate chemotherapy. In the office patient was very short of breath and oxygen was low. chest x-ray showed that he has pneumonia  1. 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 Acute bronchospasm With Acute COPD exacerbation  with long standing h/o smoking and CT chest showing emphysema/COPD changes -pt has /o Pulmonary fibrosis (seen on CXR's since 2015) will treat with nebulizers, steroids and antibiotics Pulmonary rehab recommended  patient not requiring significant amount of oxygen  to keep his sats around 88-90 he is currently on 6 L had to be placed on nonrebreather. Nighttime. -Spoke with Dr. Juanell Fairly from pulmonary to see patient. -CODE STATUS re- addressed patient is a full code  3. Lung cancer -seen by dr  Grayland Ormond-- recommends port placement as out pt and f/u at cancer center -pt will get his port placed at a later date.  4. GERD  - on PPIs  5. Elevated troponin likely due to demand ischemia   6. Miscellaneous Lovenox for DVT prophylaxis  PT recommends  HHPT but will have to reassess that since respiraotry status declined  Consider O2 sats ICU for close monitoring if deemed necessary by pulmonologist  Case discussed with Care Management/Social Worker. Management plans discussed with the patient, family and they are in agreement.  CODE STATUS: Full  DVT Prophylaxis: lovenox  TOTAL TIME TAKING CARE OF THIS PATIENT: *25* minutes.  >50% time spent on counselling and coordination of care  POSSIBLE D/C IN few DAYS, DEPENDING ON CLINICAL CONDITION.  Note: This dictation was prepared with Dragon dictation along with smaller phrase technology. Any transcriptional errors that result from this process are unintentional.  Duaine Radin M.D on 75/27/2018 at 7:46 AM  Between 7am to 6pm - Pager - [phone]  After 6pm go to [email or web address] - password [email or web address] [facility]  Office  [phone]  CC: Primary care physician; Hortencia Pilar, MD

## 2016-11-06 NOTE — Plan of Care (Signed)
Problem: Pain Managment: Goal: General experience of comfort will improve Outcome: Progressing Pt continues to have scheduled Pain medications, tolerating whole in applesauce to date this shift; no complaints of pain

## 2016-11-06 NOTE — Consult Note (Signed)
Consultation Note Date: 11/06/2016   Patient Name: Dean Davidson  DOB: 18-Feb-1941  MRN: [id number]  Age / Sex: 76 y.o., male  PCP: Hortencia Pilar, MD Referring Physician: Fritzi Mandes, MD  Reason for Consultation: Establishing goals of care  HPI/Patient Profile: 76 y.o. 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 Faith Rogue office for port placement with shortness of breath. CXR and CT confirm multifocal pneumonia with lung cancer. Receiving antibiotics. Acute bronchospasm with COPD exacerbation secondary to pulmonary fibrosis. Started on nebulizers and steroids. Intermittently between 6L [place] and non-rebreather. Desaturations with minimal movement/ambulation. Pulmonology consulted-recommending discontinuing methotrexate for RA. Respiratory panel pending. If patient does not improve, may need bronchoscopy. Oncology and radiation oncology have evaluated the patient and plan to start chemotherapy and radiation if clinical status improves. Palliative medicine consultation for goals of care.    Clinical Assessment and Goals of Care: I have reviewed medical records, discussed with Dr. Posey Pronto, and met with patient, wife, and sister at bedside to discuss diagnosis, prognosis, GOC, EOL wishes, disposition and options. Patient awake, alert, and oriented. Full capacity to participate in the conversation. On 6L [place] and with intermittent dyspnea at rest.   Introduced Palliative Medicine as specialized medical care for people living with serious illness. It focuses on providing relief from the symptoms and stress of a serious illness. The goal is to improve quality of life for both the patient and the family.  We discussed a brief life review of the patient. Mr. Tacy Dura and his wife have been married for 48  years. They have one son. They speak highly of their "baby" the dog named [place]. Sister, Stanton Kidney, at bedside speaks of how intelligent they are as a couple "an Public affairs consultant" and "can solve any problem when they put their heads together." Mr. Tacy Dura was diagnosed with RA 5 years ago and pulmonary fibrosis 3 years ago. He suffers from chronic pain from RA.   Discussed hospital diagnoses, interventions, new diagnosis of cancer, and underlying chronic lung condition. Patient and family understand the severity of his current respiratory status. He is not able to work with physical therapy and de-sats very easily on 6L [place], often to 70's. Intermittently requiring venti-mask or NRB mask. They understand he is not stable for port placement, chemo, or radiation unless he shows clinical improvement.  Wife states "our goal is to get him home"   Advanced directives, concepts specific to code status, artifical feeding and hydration, and rehospitalization were considered and discussed. Explained high risk for respiratory decompensation requiring intubation/mechanical ventilation. Patient states "I don't want to go through that." Also says no to resuscitation. Wife and sister "support any decision he makes." Educated on DNR/DNI status and that I agree with his decision for DNR/DNI with age, chronic co-morbidities, cancer, and unlikelihood of him returning to independent individual if he would survive resuscitation/life support.   The difference between aggressive medical intervention and comfort care  was considered in light of the patient's goals of care. He wants to go home and be with his family and dog. He would not want to pursue bronch if he continued to decline. Educated on transition from aggressive medical interventions to comfort only--ensuring comfort, quality, and dignity. Educated on hospice services and focus on symptom management. Patient and family agrees this is the best option if he does not show improvement  through the weekend.   Discussed prn Roxanol and xanax as needed for comfort and relief of suffering. Patient/family agree.   At this point, code status now DNR/DNI. Continue antibiotics, steroids, and nebs through the weekend. Watchful waiting.  Questions and concerns were addressed.  Hard Choices booklet left for review. Provided emotional and spiritual support.    SUMMARY OF RECOMMENDATIONS    DNR/DNI  Watchful waiting through the weekend. Continue current interventions. If necessary, use venti mask/NRB. No intubation.   Symptom management--see below.  Patient/family realistic with condition and guarded prognosis. Open to hospice services.   If he does not show improvement through the weekend, likely home with hospice services early next week.   PMT not at [facility] over the weekend but will f/u Monday.   Code Status/Advance Care Planning:  DNR   Symptom Management:   Roxanol 27m PO q3h prn pain, dyspnea, air hunger  Xanax 0.263mPO BID prn anxiety  Palliative Prophylaxis:   Aspiration, Delirium Protocol, Frequent Pain Assessment, Oral Care and Turn Reposition  Additional Recommendations (Limitations, Scope, Preferences):  Full Scope Treatment  Psycho-social/Spiritual:   Desire for further Chaplaincy support:yes  Additional Recommendations: Caregiving  Support/Resources and Education on Hospice  Prognosis:   Unable to determine-guarded with pneumonia, new diagnosed lung cancer, and underlying COPD/pulmonary fibrosis. Acute respiratory failure requiring intermittent NRB mask.   Discharge Planning: To Be Determined likely home with hospice services if he does not show improvement through the weekend     Primary Diagnoses: Present on Admission: . PNA (pneumonia)   I have reviewed the medical record, interviewed the patient and family, and examined the patient. The following aspects are pertinent.  Past Medical History:  Diagnosis Date  . Arthritis     RHEUMAT[place]  . [place]   SQUAMOUS CELL CARCINOMA LEFT LUNG JUST DIAGNOSED 10-16-16 BY DR FIGrayland OrmondER WIFE  . GERD (gastroesophageal reflux disease)    OCC  . Osteoporosis   . [place]   Social History   Social History  . Marital status: Married    Spouse name: N/A  . Number of children: N/A  . Years of education: N/A   Social History Main Topics  . Smoking status: Former Smoker    Packs/day: 1.00    Years: 50.00    Types: Cigarettes    Quit date: 07/13/2012  . Smokeless tobacco: Never Used  . Alcohol use No  . Drug use: No  . Sexual activity: Not Asked   Other Topics Concern  . None   Social History Narrative  . None   Family History  Problem Relation Age of Onset  . Epilepsy Mother   . Cancer Neg Hx    Scheduled Meds: . amitriptyline  50 mg Oral QHS  . calcitonin (salmon)  1 spray Alternating Nares QHS  . chlorhexidine  15 mL Mouth Rinse BID  . docusate sodium  100 mg Oral BID  . enoxaparin (LOVENOX) injection  40 mg Subcutaneous Q24H  . insulin aspart  0-5 Units Subcutaneous QHS  . insulin aspart  0-9 Units Subcutaneous TID WC  . insulin glargine  5 Units Subcutaneous Daily  . ipratropium-albuterol  3 mL Nebulization Q6H  . levofloxacin  500 mg Oral Daily  . mouth rinse  15 mL Mouth Rinse q12n4p  . methylPREDNISolone (SOLU-MEDROL) injection  60 mg Intravenous Q6H  . oxyCODONE  15 mg Oral Q4H  . sodium chloride flush  3 mL Intravenous Q12H   Continuous Infusions: . sodium chloride     PRN Meds:.sodium chloride, acetaminophen **OR** acetaminophen, ALPRAZolam, guaiFENesin, menthol-cetylpyridinium, morphine, ondansetron **OR** ondansetron (ZOFRAN) IV, prochlorperazine, sodium chloride flush Medications Prior to Admission:  Prior to Admission medications   Medication Sig Start Date End Date Taking? Authorizing Provider  amitriptyline (ELAVIL) 50 MG tablet Take 50 mg by mouth at bedtime.   Yes Historical Provider, MD  calcitonin,  salmon, (MIACALCIN/FORTICAL) 200 UNIT/ACT nasal spray 1 spray at bedtime. Left nostril at 2130 every evening   Yes Historical Provider, MD  furosemide (LASIX) 20 MG tablet Take 20 mg by mouth daily.   Yes Historical Provider, MD  methotrexate 50 MG/2ML injection Inject 25 mg into the muscle every Saturday.    Yes Historical Provider, MD  oxyCODONE (ROXICODONE) 15 MG immediate release tablet Take 15 mg by mouth every 4 (four) hours.    Yes Historical Provider, MD  predniSONE (DELTASONE) 5 MG tablet Take 5 mg by mouth 2 (two) times daily with a meal.   Yes Historical Provider, MD  lidocaine-prilocaine (EMLA) cream Apply to affected area once Patient not taking: Reported on 11/02/2016 11/01/16   Lloyd Huger, MD  ondansetron (ZOFRAN) 8 MG tablet Take 1 tablet (8 mg total) by mouth 2 (two) times daily as needed for refractory nausea / vomiting. Start on day 3 after chemo. Patient not taking: Reported on 11/02/2016 11/01/16   Lloyd Huger, MD  prochlorperazine (COMPAZINE) 10 MG tablet TAKE 1 TABLET BY MOUTH EVERY 6 HOURS AS NEEDED Patient not taking: Reported on 11/02/2016 11/01/16   Lloyd Huger, MD   No Known Allergies Review of Systems  Constitutional: Positive for unexpected weight change.  Respiratory: Positive for shortness of breath.   Neurological: Positive for weakness.   Physical Exam  Constitutional: He is oriented to person, place, and time. He is cooperative. He appears ill.  HENT:  Head: Normocephalic and atraumatic.  Cardiovascular: Regular rhythm and normal heart sounds.   Pulmonary/Chest: He has decreased breath sounds.  Dyspnea at rest. On 6L [place].   Abdominal: Normal appearance.  Neurological: He is alert and oriented to person, place, and time.  Skin: Skin is warm and dry. There is pallor.  Psychiatric: He has a normal mood and affect. His speech is normal and behavior is normal. Cognition and memory are normal.  Nursing note and vitals reviewed.  Vital Signs:  BP 117/69 (BP Location: Left Arm)   Pulse 75   Temp 97.7 F (36.5 C) (Oral)   Resp 19   Ht _0  (1.803 m)   Wt 76 kg (167 lb 9.6 oz)   SpO2 91%   BMI 23.38 kg/m  Pain Assessment: No/denies pain   Pain Score: 4    SpO2: SpO2: 91 % O2 Device:SpO2: 91 % O2 Flow Rate: .O2 Flow Rate (L/min): 6 L/min  IO: Intake/output summary:   Intake/Output Summary (Last 24 hours) at 11/06/16 1524 Last data filed at 11/06/16 1214  Gross per 24 hour  Intake              120 ml  Output              350 ml  Net             -230 ml    LBM: Last BM Date: 11/05/16 Baseline Weight: Weight: 78.9 kg (174 lb) Most recent weight: Weight: 76 kg (167 lb 9.6 oz)     Palliative Assessment/Data: PPS 40%   Flowsheet Rows     Most Recent Value  Intake Tab  Referral Department  Hospitalist  Unit at Time of Referral  Oncology Unit  Palliative Care Primary Diagnosis  Pulmonary  Date Notified  11/06/16  Palliative Care Type  New Palliative care  Reason for referral  Clarify Goals of Care  Date of Admission  11/02/16  # of days IP prior to Palliative referral  4  Clinical Assessment  Palliative Performance Scale Score  40%  Psychosocial & Spiritual Assessment  Palliative Care Outcomes  Patient/Family meeting held?  Yes  Who was at the meeting?  patient, wife, sister  Palliative Care Outcomes  Clarified goals of care, Counseled regarding hospice, Provided psychosocial or spiritual support, Changed CPR status, ACP counseling assistance, Improved pain interventions, Improved non-pain symptom therapy, Provided end of life care assistance      Time In: 1140 Time Out: 1250 Time Total: 36mn Greater than 50%  of this time was spent counseling and coordinating care related to the above assessment and plan.  Signed by:  MIhor Dow FNP-C Palliative Medicine Team  Phone: [phone]: [phone]  Please contact Palliative Medicine Team phone at [phone] questions and concerns.  For  individual provider: See AShea Evans

## 2016-11-06 NOTE — Progress Notes (Signed)
[place] [facility] [place] NAME: Dean Davidson    MR#:  [id number]  DATE OF BIRTH:  1941-01-08  SUBJECTIVE:   Patient's breathing became more worse in the nighttime requiring nonrebreather sats dropped into the upper 80s with minimal activity   wife in the room. I checked his sats on 6 L or 88-90% REVIEW OF SYSTEMS:   Review of Systems  Constitutional: Negative for chills, fever and weight loss.  HENT: Negative for ear discharge, ear pain and nosebleeds.   Eyes: Negative for blurred vision, pain and discharge.  Respiratory: Positive for cough. Negative for sputum production, shortness of breath, wheezing and stridor.   Cardiovascular: Negative for chest pain, palpitations, orthopnea and PND.  Gastrointestinal: Negative for abdominal pain, diarrhea, nausea and vomiting.  Genitourinary: Negative for frequency and urgency.  Musculoskeletal: Negative for back pain and joint pain.  Neurological: Positive for weakness. Negative for sensory change, speech change and focal weakness.  Psychiatric/Behavioral: Negative for depression and hallucinations. The patient is not nervous/anxious.    Tolerating Diet:yes Tolerating PT: pending  DRUG ALLERGIES:  No Known Allergies  VITALS:  Blood pressure 117/69, pulse 75, temperature 97.7 F (36.5 C), temperature source Oral, resp. rate 19, height '5\' 11"'$  (1.803 m), weight 76 kg (167 lb 9.6 oz), SpO2 92 %.  PHYSICAL EXAMINATION:   Physical Exam  GENERAL:  76 y.o.-year-old patient lying in the bed with no acute distress. chronically EYES: Pupils equal, round, reactive to light and accommodation. No scleral icterus. Extraocular muscles intact.  HEENT: Head atraumatic, normocephalic. Oropharynx and nasopharynx clear.  NECK:  Supple, no jugular venous distention. No thyroid enlargement, no tenderness.  LUNGS: coarse breath sounds bilaterally, no wheezing, rales, rhonchi.Mild  use of accessory muscles of  respiration.  CARDIOVASCULAR: S1, S2 normal. No murmurs, rubs, or gallops.  ABDOMEN: Soft, nontender, nondistended. Bowel sounds present. No organomegaly or mass.  EXTREMITIES: No cyanosis, clubbing or edema b/l.    NEUROLOGIC: Cranial nerves II through XII are intact. No focal Motor or sensory deficits b/l.   PSYCHIATRIC:  patient is alert and oriented x 3.  SKIN: No obvious rash, lesion, or ulcer.   LABORATORY PANEL:  CBC  Recent Labs Lab 11/03/16 0137  WBC 7.0  HGB 11.8*  HCT 34.3*  PLT 360    Chemistries   Recent Labs Lab 11/03/16 0137  NA 132*  K 4.4  CL 97*  CO2 28  GLUCOSE 324*  BUN 13  CREATININE 0.66  CALCIUM 8.2*   Cardiac Enzymes  Recent Labs Lab 11/03/16 0137  TROPONINI 0.04*   RADIOLOGY:  No results found. ASSESSMENT AND PLAN:  Dean Davidson  is a 76 y.o. male with a known history of Rheumatoid arthritis, GERD, new diagnosis squamous cell carcinoma of the left lung, osteoporosis as well as recent diagnosis of tumor in the throat who went to see Dr. Faith Rogue for Port-A-Cath placement to initiate chemotherapy. In the office patient was very short of breath and oxygen was low. chest x-ray showed that he has pneumonia  1. 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 Acute bronchospasm With Acute COPD exacerbation  with long standing h/o smoking, pulmonary fibrosis and CT chest showing emphysema/COPD changes -pt has /o Pulmonary fibrosis (seen on CXR's since 2015) -will treat with nebulizers, steroids and antibiotics - patient now requiring significant amount of oxygen to  keep his sats around 88-90 he is currently on 6 L had to be placed on nonrebreather. ] -Spoke with Dr. Juanell Fairly from pulmonary. Appreciate input. Recommends d/c MTX. Pt has h/o RA and Pulm fibrosis and both contributing to current worsening  respiratory decline. -CODE STATUS re- addressed patient is a full code  3. Lung cancer-new diagnosis -seen by dr Grayland Ormond-- recommends port placement as out pt and f/u at cancer center -pt will get his port placed at a later date.  4. GERD  - on PPIs  5. Elevated troponin likely due to demand ischemia   6. Miscellaneous Lovenox for DVT prophylaxis  PT recommends  HHPT but will have to reassess that since respiraotry status declined  D/w dr Grayland Ormond. Will get Palliative care involved.  Case discussed with Care Management/Social Worker. Management plans discussed with the patient, family and they are in agreement.  CODE STATUS: Full  DVT Prophylaxis: lovenox  TOTAL TIME TAKING CARE OF THIS PATIENT: *25* minutes.  >50% time spent on counselling and coordination of care  POSSIBLE D/C IN few DAYS, DEPENDING ON CLINICAL CONDITION.  Note: This dictation was prepared with Dragon dictation along with smaller phrase technology. Any transcriptional errors that result from this process are unintentional.  Jenay Morici M.D on 11/06/2016 at 7:51 AM  Between 7am to 6pm - Pager - [phone]  After 6pm go to [email or web address] - password [email or web address] [facility]  Office  [phone]  CC: Primary care physician; Hortencia Pilar, MD

## 2016-11-06 NOTE — Progress Notes (Signed)
Inpatient Diabetes Program Recommendations  AACE/ADA: New Consensus Statement on Inpatient Glycemic Control (2015)  Target Ranges:  Prepandial:   less than 140 mg/dL      Peak postprandial:   less than 180 mg/dL (1-2 hours)      Critically ill patients:  140 - 180 mg/dL   Lab Results  Component Value Date   GLUCAP 322 (H) 11/06/2016    Inpatient Diabetes Program Recommendations: Please consider changing Novolog correction 0-9 units to tid (d/c Novolog correction q6h). Continue Novolog 0-5 units qhs   Gentry Fitz, RN, [place], [place], CDE Diabetes Coordinator Inpatient Diabetes Program  [phone] (Team Pager) [phone] ([place]) 11/06/2016 12:25 PM

## 2016-11-06 NOTE — Progress Notes (Signed)
Nutrition Follow-up  DOCUMENTATION CODES:   Severe malnutrition in context of acute illness/injury  INTERVENTION:  Continue Mighty Shake II BID with lunch and dinner, each supplement provides 480-500 kcals and 20-23 grams of protein. Patient prefers chocolate.  Continue Magic cup BID with lunch and dinner, each supplement provides 290 kcal and 9 grams of protein.  NUTRITION DIAGNOSIS:   Malnutrition (Severe) related to acute illness (recent diagnosis lung and epiglottis cancer, dysphagia s/p excision of epiglottis) as evidenced by percent weight loss, moderate depletions of muscle mass, moderate depletion of body fat.  Ongoing.  GOAL:   Patient will meet greater than or equal to 90% of their needs  Progressing.  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  -Per chart patient has had worsening breathing. He required a nonrebreather and is now on 6L [place].  -PMT has been consulted due to severe respiratory failure.  Spoke with patient at bedside. Wife not present at time of assessment. Patient reports he has a good appetite. He reports if he likes the food that comes, he can finish most or all of the trays. He enjoys the [employer] and [employer] II and has been eating them.   Meal Completion: 30-100% (average of 61%)  Medications reviewed and include: Colace, Novolog sliding scale TID with meals and QHS, Lantus 5 units daily, methylprednisolone 60 mg Q6hrs.   Labs reviewed: CBG 203-322 past 24 hrs  Diet Order:  DIET - DYS 1 Room service appropriate? Yes; Fluid consistency: Nectar Thick  Skin:  Reviewed, no issues  Last BM:  11/06/2016 - type 4  per chart  Height:   Ht Readings from Last 1 Encounters:  11/02/16 '5\' 11"'$  (1.803 m)    Weight:   Wt Readings from Last 1 Encounters:  11/02/16 167 lb 9.6 oz (76 kg)    Ideal Body Weight:  78.2 kg  BMI:  Body mass index is 23.38 kg/m.  Estimated Nutritional Needs:   Kcal:  0349-1791 (MSJ x 1.3-1.5)  Protein:  90-115 grams (1.2-1.5 grams/kg)  Fluid:  1.9-2.3 L/day (25-30 ml/kg)  EDUCATION NEEDS:   Education needs addressed  Willey Blade, MS, RD, LDN Pager: [phone] After Hours Pager: [phone]

## 2016-11-06 NOTE — Plan of Care (Signed)
Problem: Safety: Goal: Ability to remain free from injury will improve Outcome: Progressing Pt remains injury free this shift

## 2016-11-06 NOTE — Progress Notes (Addendum)
Inpatient Diabetes Program Recommendations  AACE/ADA: New Consensus Statement on Inpatient Glycemic Control (2015)  Target Ranges:  Prepandial:   less than 140 mg/dL      Peak postprandial:   less than 180 mg/dL (1-2 hours)      Critically ill patients:  140 - 180 mg/dL   Results for GILMAR, BUA (MRN [id number]) as of 11/06/2016 08:37  Ref. Range 11/05/2016 08:19 11/05/2016 17:23 11/05/2016 20:24 11/06/2016 03:31 11/06/2016 07:49  Glucose-Capillary Latest Ref Range: 65 - 99 mg/dL 110 (H) 260 (H) 302 (H) 268 (H) 203 (H)   Review of Glycemic Control  Diabetes history: No Outpatient Diabetes medications: NA Current orders for Inpatient glycemic control: Novolog 0-9 units TID with meals, Novolog 0-5 units QHS  Inpatient Diabetes Program Recommendations: Insulin - Basal: If steroids are continued, please consider ordering Lantus 5 units Q24H. Please note that if basal insulin is ordered as recommended, please note that is will need to be adjusted as steroids are tapered. Insulin - Meal Coverage: While ordered steroids, please consider ordering Novolog 3 units TID with meals for meal coverage if patient eats at least 50% of meals.  Thanks, Barnie Alderman, RN, MSN, CDE Diabetes Coordinator Inpatient Diabetes Program [phone] (Team Pager from 8am to 5pm)

## 2016-11-07 LAB — PROCALCITONIN: Procalcitonin: <0.1 ng/mL

## 2016-11-07 LAB — GLUCOSE, CAPILLARY
GLUCOSE-CAPILLARY: 196 mg/dL — AB (ref 65–99)
Glucose-Capillary: 281 mg/dL — ABNORMAL HIGH (ref 65–99)
Glucose-Capillary: 287 mg/dL — ABNORMAL HIGH (ref 65–99)
Glucose-Capillary: 163 mg/dL — ABNORMAL HIGH (ref 65–99)

## 2016-11-07 LAB — CREATININE, SERUM
CREATININE: 0.66 mg/dL (ref 0.61–1.24)
GFR calc non Af Amer: >60 mL/min (ref >60)

## 2016-11-07 LAB — CBC
HCT: 35.3 % — ABNORMAL LOW (ref 40.0–52.0)
Hemoglobin: 11.9 g/dL — ABNORMAL LOW (ref 13.0–18.0)
MCH: 33.4 pg (ref 26.0–34.0)
MCHC: 33.8 g/dL (ref 32.0–36.0)
MCV: 98.7 fL (ref 80.0–100.0)
Platelets: 417 K/uL (ref 150–440)
RBC: 3.58 MIL/uL — ABNORMAL LOW (ref 4.40–5.90)
RDW: 14.7 % — ABNORMAL HIGH (ref 11.5–14.5)
WBC: 18.2 K/uL — ABNORMAL HIGH (ref 3.8–10.6)

## 2016-11-07 LAB — CULTURE, BLOOD (ROUTINE X 2)
Culture: NO GROWTH
Culture: NO GROWTH

## 2016-11-07 NOTE — Progress Notes (Signed)
[place] [facility] [place] NAME: Dean Davidson    MR#:  [id number]  DATE OF BIRTH:  Apr 15, 1941  SUBJECTIVE:   Patient's breathing became more worse in the nighttime requiring nonrebreather sats dropped into the upper 80s with minimal activity  Breathing about the same. Roxanol seems to help. Still on 6 L nasal cannula oxygen. Patient denies any other complaints. REVIEW OF SYSTEMS:   Review of Systems  Constitutional: Negative for chills, fever and weight loss.  HENT: Negative for ear discharge, ear pain and nosebleeds.   Eyes: Negative for blurred vision, pain and discharge.  Respiratory: Positive for cough. Negative for sputum production, shortness of breath, wheezing and stridor.   Cardiovascular: Negative for chest pain, palpitations, orthopnea and PND.  Gastrointestinal: Negative for abdominal pain, diarrhea, nausea and vomiting.  Genitourinary: Negative for frequency and urgency.  Musculoskeletal: Negative for back pain and joint pain.  Neurological: Positive for weakness. Negative for sensory change, speech change and focal weakness.  Psychiatric/Behavioral: Negative for depression and hallucinations. The patient is not nervous/anxious.    Tolerating Diet:yes Tolerating PT: pending  DRUG ALLERGIES:  No Known Allergies  VITALS:  Blood pressure (!) 148/76, pulse 95, temperature 97.6 F (36.4 C), temperature source Oral, resp. rate (!) 28, height '5\' 11"'$  (1.803 m), weight 76 kg (167 lb 9.6 oz), SpO2 92 %.  PHYSICAL EXAMINATION:   Physical Exam  GENERAL:  76 y.o.-year-old patient lying in the bed with no acute distress. chronically EYES: Pupils equal, round, reactive to light and accommodation. No scleral icterus. Extraocular muscles intact.  HEENT: Head atraumatic, normocephalic. Oropharynx and nasopharynx clear.  NECK:  Supple, no jugular venous distention. No thyroid enlargement, no tenderness.  LUNGS: coarse breath sounds  bilaterally, no wheezing, rales, rhonchi.Mild  use of accessory muscles of respiration.  CARDIOVASCULAR: S1, S2 normal. No murmurs, rubs, or gallops.  ABDOMEN: Soft, nontender, nondistended. Bowel sounds present. No organomegaly or mass.  EXTREMITIES: No cyanosis, clubbing or edema b/l.    NEUROLOGIC: Cranial nerves II through XII are intact. No focal Motor or sensory deficits b/l.   PSYCHIATRIC:  patient is alert and oriented x 3.  SKIN: No obvious rash, lesion, or ulcer.   LABORATORY PANEL:  CBC  Recent Labs Lab 11/07/16 0404  WBC 18.2*  HGB 11.9*  HCT 35.3*  PLT 417    Chemistries   Recent Labs Lab 11/03/16 0137 11/07/16 0404  NA 132*  --   K 4.4  --   CL 97*  --   CO2 28  --   GLUCOSE 324*  --   BUN 13  --   CREATININE 0.66 0.66  CALCIUM 8.2*  --    Cardiac Enzymes  Recent Labs Lab 11/03/16 0137  TROPONINI 0.04*   RADIOLOGY:  Dg Chest Port 1 View  Result Date: 11/06/2016 CLINICAL DATA:  Shortness of Breath.  Prior bronchoscopy. EXAM: PORTABLE CHEST 1 VIEW COMPARISON:  11/02/2016 FINDINGS: Underlying severe interstitial lung disease/ fibrosis again noted. Improving focal opacity in the right upper lobe. Left upper lobe mass not as well visualized on today's study. No definite acute process. Heart is normal size. IMPRESSION: Improving focal right upper lobe consolidation. Severe underlying fibrosis. Electronically Signed   By: Rolm Baptise M.D.   On: 11/06/2016 08:10   ASSESSMENT AND PLAN:  Veronica Guerrant  is a 76 y.o. male with a known history of Rheumatoid arthritis, GERD, new diagnosis squamous cell carcinoma of the left lung, osteoporosis as  well as recent diagnosis of tumor in the throat who went to see Dr. Faith Rogue for Port-A-Cath placement to initiate chemotherapy. In the office patient was very short of breath and oxygen was low. chest x-ray showed that he has pneumonia  1. 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 Acute bronchospasm With Acute COPD exacerbation  with long standing h/o smoking, pulmonary fibrosis and CT chest showing emphysema/COPD changes -pt has /o Pulmonary fibrosis (seen on CXR's since 2015) -will treat with nebulizers, steroids and antibiotics - patient now requiring significant amount of oxygen to keep his sats around 88-90 he is currently on 6 L had to be placed on nonrebreather. ] -Spoke with Dr. Juanell Fairly from pulmonary. Appreciate input. Recommends d/c MTX. Pt has h/o RA and Pulm fibrosis and both contributing to current worsening respiratory decline. -CODE STATUS re- addressed patient is a DO NOT RESUSCITATE   3. Lung cancer-new diagnosis -seen by dr Grayland Ormond-- recommends port placement as out pt and f/u at cancer center -pt will get his port placed at a later date.  4. GERD  - on PPIs  5. Elevated troponin likely due to demand ischemia   6. Miscellaneous Lovenox for DVT prophylaxis    D/w dr Grayland Ormond. Appreciate palliative care input. Patient's goal is to go home we'll keep him on the weekends see how he does and transitioned to home with hospice services early next week depending on how he does over the weekend  Case discussed with Care Management/Social Worker. Management plans discussed with the patient, family and they are in agreement.  CODE STATUS: Full  DVT Prophylaxis: lovenox  TOTAL TIME TAKING CARE OF THIS PATIENT: *25* minutes.  >50% time spent on counselling and coordination of care  POSSIBLE D/C IN few DAYS, DEPENDING ON CLINICAL CONDITION.  Note: This dictation was prepared with Dragon dictation along with smaller phrase technology. Any transcriptional errors that result from this process are unintentional.  Emmanuela Ghazi M.D on 11/07/2016 at 10:42 AM  Between 7am to 6pm - Pager - [phone]  After 6pm go to [email or web address] - password [email or web address] [facility]  Office  [phone]  CC: Primary care physician; Hortencia Pilar, MD

## 2016-11-07 NOTE — Plan of Care (Signed)
Problem: Respiratory: Goal: Respiratory status will improve Outcome: Not Progressing Continues on 6L O2

## 2016-11-07 NOTE — Progress Notes (Signed)
Physical Therapy Treatment Patient Details Name: Dean Davidson MRN: [id number] DOB: [birth year] Today's Date: 11/07/2016    History of Present Illness Pt is a 76 y/o M who went to see Dr. Faith Rogue for port-a-cath placement to initiate chemotherapy when he became SOB and pt was sent to the ED.  Pt found to have pneumonia. Pt's PMH includes RA, squamous cell carcinoma of L lung, osteoporosis, recent diagnosis of tumor in the throat, sacral fx (09/2016), lumbar surgery, neck surgery.    PT Comments    Pt initially does not agree to PT; pt given lengthy explanation of what he has been through regarding O2 and therapy and states he has seen other people "have" PT without improvement; therefore, states "I don't believe in this deep therapy stuff". Pt also states he is "on his way out" and "there is nothing you can do". Family states pt will get up into chair later with her help. Explained to pt/family that staff should be present to assist pt moving out of bed and offered to do so at this time. Pt agreeable to therapist assisting. Pt able to get to edge of bed without physical assist; requires time edge of bed sitting due to mild increased shortness of breath. Pt refuses rolling walker for stand/few steps to chair. Pt performs with Min A with therapist. Pt feels much better up in chair and apologies for "being hard on you". No further PT wished. Reinforced to call for assist when wishing back to bed etc., pt/family agreeable. Continue to offer PT for progression of function as pt wishes.   Follow Up Recommendations  Home health PT     Equipment Recommendations  None recommended by PT    Recommendations for Other Services       Precautions / Restrictions Precautions Precautions: Other (comment);Fall Precaution Comments: sacral fx in 09/2016 which pt says was due to prednisone for RA.  Watch O2, was not O2 dependent PTA Restrictions Weight Bearing Restrictions: No    Mobility  Bed  Mobility Overal bed mobility: Modified Independent             General bed mobility comments: Increased time/effort and use of rails. Mild increased shortness of breath. Sits edge of bed several minutes before attempted stand  Transfers Overall transfer level: Needs assistance Equipment used: None Transfers: Sit to/from Stand Sit to Stand: Min assist         General transfer comment: Pt refuses rw, but agreeable to physical assist. Slow to rise, flexed knee hip and trunk posture, fairly steady with Min A  Ambulation/Gait Ambulation/Gait assistance: Min assist Ambulation Distance (Feet): 3 Feet Assistive device: None Gait Pattern/deviations: Step-to pattern   Gait velocity interpretation: <1.8 ft/sec, indicative of risk for recurrent falls General Gait Details: small side steps bed to chair. Prior to getting up, pt notes O2 drops quickly and fatigues quickly. Performs well with Min A   Stairs            Wheelchair Mobility    Modified Rankin (Stroke Patients Only)       Balance Overall balance assessment: Needs assistance Sitting-balance support: Bilateral upper extremity supported;Feet supported Sitting balance-Leahy Scale: Good     Standing balance support: Bilateral upper extremity supported (on therapists' arms) Standing balance-Leahy Scale: Poor                              Cognition Arousal/Alertness: Awake/alert Behavior During Therapy: WFL for tasks  assessed/performed Overall Cognitive Status: Within Functional Limits for tasks assessed                                        Exercises      General Comments        Pertinent Vitals/Pain Pain Assessment:  (States "honey pick a part" )    Home Living                      Prior Function            PT Goals (current goals can now be found in the care plan section)      Frequency    Min 2X/week      PT Plan Current plan remains appropriate     Co-evaluation             End of Session Equipment Utilized During Treatment: Oxygen (refused gait belt) Activity Tolerance: Patient limited by fatigue;Other (comment) (does not wish additional therapy) Patient left: in chair;with call bell/phone within reach;with family/visitor present (pt/family refuses alarm; reinforced to call staff. Agreeable)   PT Visit Diagnosis: Unsteadiness on feet (R26.81);Muscle weakness (generalized) (M62.81)     Time: 7897-8478 PT Time Calculation (min) (ACUTE ONLY): 19 min  Charges:  $Therapeutic Activity: 8-22 mins                    G Codes:        Larae Grooms, PTA 11/07/2016, 10:29 AM

## 2016-11-08 ENCOUNTER — Inpatient Hospital Stay: Payer: Medicare Other

## 2016-11-08 LAB — GLUCOSE, CAPILLARY
GLUCOSE-CAPILLARY: 259 mg/dL — AB (ref 65–99)
GLUCOSE-CAPILLARY: 262 mg/dL — AB (ref 65–99)
GLUCOSE-CAPILLARY: 118 mg/dL — AB (ref 65–99)
Glucose-Capillary: 210 mg/dL — ABNORMAL HIGH (ref 65–99)

## 2016-11-08 MED ORDER — METHYLPREDNISOLONE SODIUM SUCC 125 MG IJ SOLR
60.0000 mg | Freq: Two times a day (BID) | INTRAMUSCULAR | Status: DC
  Administered 2016-11-08 – 2016-11-09 (×2): 60 mg via INTRAVENOUS
  Filled 2016-11-08 (×2): qty 2

## 2016-11-08 MED ORDER — ALPRAZOLAM 0.5 MG PO TABS
0.2500 mg | ORAL_TABLET | Freq: Three times a day (TID) | ORAL | Status: DC | PRN
  Administered 2016-11-08 – 2016-11-09 (×5): 0.25 mg via ORAL
  Filled 2016-11-08 (×5): qty 1

## 2016-11-08 NOTE — Plan of Care (Signed)
Problem: Education: Goal: Knowledge of disease or condition will improve Outcome: Progressing Pt to go home with hospice care  Family aware pall. On board  Problem: Respiratory: Goal: Respiratory status will improve Outcome: Not Progressing Pt remains on [place] at 6 l. sats  Low  90sat rest but drops   Mid / upper  80s  With  Exertion.

## 2016-11-08 NOTE — Progress Notes (Signed)
[place] at [place] NAME: Dean Davidson    Dean#:  [id number]  DATE OF BIRTH:  02/10/1941  SUBJECTIVE:   Breathing about the same. Dean Davidson seems to help. Still on 6 L nasal cannula oxygen. Patient denies any other complaints. sats drop to lower 80's with minimal activity REVIEW OF SYSTEMS:   Review of Systems  Constitutional: Negative for chills, fever and weight loss.  HENT: Negative for ear discharge, ear pain and nosebleeds.   Eyes: Negative for blurred vision, pain and discharge.  Respiratory: Positive for cough. Negative for sputum production, shortness of breath, wheezing and stridor.   Cardiovascular: Negative for chest pain, palpitations, orthopnea and PND.  Gastrointestinal: Negative for abdominal pain, diarrhea, nausea and vomiting.  Genitourinary: Negative for frequency and urgency.  Musculoskeletal: Negative for back pain and joint pain.  Neurological: Positive for weakness. Negative for sensory change, speech change and focal weakness.  Psychiatric/Behavioral: Negative for depression and hallucinations. The patient is not nervous/anxious.    Tolerating Diet:yes Tolerating PT: HHPT  DRUG ALLERGIES:  No Known Allergies  VITALS:  Blood pressure (!) 141/74, pulse (!) 45, temperature 97.7 F (36.5 C), temperature source Oral, resp. rate (!) 28, height '5\' 11"'$  (1.803 m), weight 76 kg (167 lb 9.6 oz), SpO2 90 %.  PHYSICAL EXAMINATION:   Physical Exam  GENERAL:  76 y.o.-year-old patient lying in the bed with no acute distress. chronically EYES: Pupils equal, round, reactive to light and accommodation. No scleral icterus. Extraocular muscles intact.  HEENT: Head atraumatic, normocephalic. Oropharynx and nasopharynx clear. Oral breating NECK:  Supple, no jugular venous distention. No thyroid enlargement, no tenderness.  LUNGS: coarse breath sounds bilaterally, no wheezing, rales, rhonchi.Mild  use of accessory muscles of  respiration.  CARDIOVASCULAR: S1, S2 normal. No murmurs, rubs, or gallops.  ABDOMEN: Soft, nontender, nondistended. Bowel sounds present. No organomegaly or mass.  EXTREMITIES: No cyanosis, clubbing or edema b/l.    NEUROLOGIC: Cranial nerves II through XII are intact. No focal Motor or sensory deficits b/l.   PSYCHIATRIC:  patient is alert and oriented x 3.  SKIN: No obvious rash, lesion, or ulcer.   LABORATORY PANEL:  CBC  Recent Labs Lab 11/07/16 0404  WBC 18.2*  HGB 11.9*  HCT 35.3*  PLT 417    Chemistries   Recent Labs Lab 11/03/16 0137 11/07/16 0404  NA 132*  --   K 4.4  --   CL 97*  --   CO2 28  --   GLUCOSE 324*  --   BUN 13  --   CREATININE 0.66 0.66  CALCIUM 8.2*  --    Cardiac Enzymes  Recent Labs Lab 11/03/16 0137  TROPONINI 0.04*   RADIOLOGY:  No results found. ASSESSMENT AND PLAN:  Dean Davidson  is a 76 y.o. male with a known history of Rheumatoid arthritis, GERD, new diagnosis squamous cell carcinoma of the left lung, osteoporosis as well as recent diagnosis of tumor in the throat who went to see Dean Davidson for Port-A-Cath placement to initiate chemotherapy. In the office patient was very short of breath and oxygen was low. chest x-ray showed that he has pneumonia  1. Multifocal Pneumonia Patient with new dx of squamous cell lung cancer and squamous cell epiglottic cancer -CT chest shows multifocal pneumonia -Continue  with Levaquin  2.Acute on chronic COPD exacerbation  with long standing h/o smoking, severe pulmonary fibrosis and CT chest showing emphysema/COPD changes -pt has /o Pulmonary fibrosis (seen  on CXR's since 2015) -will treat with nebulizers, steroids and antibiotics - patient now requiring significant amount of oxygen to keep his sats around 88-90 he is currently on 6 L  -Appreciate input. Recommends d/c MTX. Pt has h/o RA and Pulm fibrosis and both contributing to current worsening respiratory decline. -CODE STATUS re-  addressed patient is a DO NOT RESUSCITATE  D/w pt and wife and their goal is to go home tomorrow. They understand sats dropping with activity and ok with 5-6 liter [place] oxygen at present. Dean Davidson does not want HFNC or ventimask. -xanax 0.25 mg tid prn and cont Dean Davidson for symptom mnx.  3. Lung cancer-new diagnosis -seen by Dean Davidson  4. GERD  - on PPIs  5. Miscellaneous Lovenox for DVT prophylaxis   Plan is to d/c to home with hospice tomorrow.  Appreciate palliative care input. Patient's goal is to go home we'll keep him on the weekends see how he does and transitioned to home with hospice services early next week depending on how he does over the weekend  Case discussed with Care Management/Social Worker. Management plans discussed with the patient, family and they are in agreement.  CODE STATUS: Full  DVT Prophylaxis: lovenox  TOTAL TIME TAKING CARE OF THIS PATIENT: *25* minutes.  >50% time spent on counselling and coordination of care  POSSIBLE D/C IN few DAYS, DEPENDING ON CLINICAL CONDITION.  Note: This dictation was prepared with Dragon dictation along with smaller phrase technology. Any transcriptional errors that result from this process are unintentional.  Dean Davidson M.D on 11/08/2016 at 8:29 AM  Between 7am to 6pm - Pager - [phone]  After 6pm go to [email or web address] - password [email or web address] [facility]  Office  [phone]  CC: Primary care physician; Dean Pilar, MD

## 2016-11-08 NOTE — Care Management Note (Signed)
Case Management Note  Patient Details  Name: Dean Davidson MRN: [id number] Date of Birth: [birth year]  Subjective/Objective:   Discussed hospice providers with Mrs Tacy Dura. Referral faxed and called to Lorelle Formosa at [facility] who is already aware of this referral. [name] in [place] in [place] but [name] looked up the address and said [facility] [name] for hospice services in his home. Heads up to [facility] that, per Dr Fritzi Mandes, [name] will be discharged home tomorrow and will need a semi electric hospital bed, a BSC, and home oxygen.  He will also require EMS transport home.                Action/Plan:       Discharge to home on Monday with [facility].    Expected Discharge Date:                  Expected Discharge Plan:     In-House Referral:     Discharge planning Services     Post Acute Care Choice:    Choice offered to:     DME Arranged:    DME Agency:     HH Arranged:    HH Agency:     Status of Service:     If discussed at [employer] [facility] [employer], dates discussed:    Additional Comments:  Orlyn Odonoghue A, RN 11/08/2016, 10:43 AM

## 2016-11-09 LAB — GLUCOSE, CAPILLARY
GLUCOSE-CAPILLARY: 163 mg/dL — AB (ref 65–99)
GLUCOSE-CAPILLARY: 235 mg/dL — AB (ref 65–99)
GLUCOSE-CAPILLARY: 182 mg/dL — AB (ref 65–99)

## 2016-11-09 LAB — CREATININE, SERUM
CREATININE: 0.69 mg/dL (ref 0.61–1.24)
GFR calc Af Amer: >60 mL/min (ref >60)
GFR calc non Af Amer: >60 mL/min (ref >60)

## 2016-11-09 LAB — PROCALCITONIN: Procalcitonin: <0.1 ng/mL

## 2016-11-09 MED ORDER — PREDNISONE 10 MG PO TABS
10.0000 mg | ORAL_TABLET | Freq: Every day | ORAL | Status: DC
  Administered 2016-11-09: 08:00:00 10 mg via ORAL
  Filled 2016-11-09: qty 1

## 2016-11-09 MED ORDER — IPRATROPIUM-ALBUTEROL 0.5-2.5 (3) MG/3ML IN SOLN: 3.0000 mL | Freq: Four times a day (QID) | RESPIRATORY_TRACT | 0 refills | Status: DC

## 2016-11-09 MED ORDER — ALPRAZOLAM 0.25 MG PO TABS: 0.2500 mg | ORAL_TABLET | Freq: Four times a day (QID) | ORAL | 0 refills | Status: DC | PRN

## 2016-11-09 MED ORDER — ALPRAZOLAM 0.5 MG PO TABS
0.2500 mg | ORAL_TABLET | Freq: Once | ORAL | Status: AC
  Administered 2016-11-09: 14:00:00 0.25 mg via ORAL
  Filled 2016-11-09: qty 1

## 2016-11-09 MED ORDER — MORPHINE SULFATE 10 MG/5ML PO SOLN: 5.0000 mg | ORAL | 0 refills | Status: DC | PRN

## 2016-11-09 NOTE — Care Management Important Message (Signed)
Important Message  Patient Details  Name: Dean Davidson MRN: [id number] Date of Birth: 1940/08/09   Medicare Important Message Given:  Yes    Shelbie Ammons, RN 11/09/2016, 8:25 AM

## 2016-11-09 NOTE — Progress Notes (Signed)
EMS came to take Patient. Discharge paper works handed over to the EMT. Patient stable, discharge education completed, verbalized understanding. IV access discontinued.

## 2016-11-09 NOTE — Discharge Summary (Signed)
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 Arthritis    RHEUMATOID  . Cancer ([place])    SQUAMOUS CELL CARCINOMA LEFT LUNG JUST DIAGNOSED 10-16-16 BY DR Grayland Ormond PER WIFE  . GERD (gastroesophageal reflux disease)    OCC  . Osteoporosis   . Sacral fracture ([place]) 09/2016    HOSPITAL COURSE:   Dean Davidson a 76 y.o.malewith a known history of Rheumatoid arthritis, GERD, new diagnosis squamous cell carcinoma of the left lung, osteoporosis as well as recent diagnosis of tumor in the throat who went to see Dr. Faith Rogue for Port-A-Cath placement to initiate chemotherapy. In the office patient was very short of breath and oxygen was low. chest x-ray showed that he has pneumonia  1. 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 Recommends d/c MTX. Pt has h/o RA and Pulm fibrosis and both contributing  to current worsening respiratory decline. -CODE STATUS re- addressed patient is a DO NOT RESUSCITATE  D/w pt and wife and their goal is to go home tomorrow. They understand sats dropping with activity and ok with 5-6 liter [place] oxygen at present. Dean Davidson does not want HFNC or ventimask. -xanax 0.25 mg qid prn and cont roxanol for symptom mnx.  3. Lung cancer-new diagnosis -seen by dr Grayland Ormond  4. GERD  - on PPIs  5. Miscellaneous Lovenox for DVT prophylaxis   Plan is to d/c to home with hospice today. CONSULTS OBTAINED:  Treatment Team:  Lloyd Huger, MD Laverle Hobby, MD  DRUG ALLERGIES:  No Known Allergies  DISCHARGE MEDICATIONS:   Current Discharge Medication List    START taking these medications   Details  ALPRAZolam (XANAX) 0.25 MG tablet Take 1 tablet (0.25 mg total) by mouth 4 (four) times daily as needed for anxiety. Qty: 30 tablet, Refills: 0    ipratropium-albuterol (DUONEB) 0.5-2.5 (3) MG/3ML SOLN Take 3 mLs by nebulization every 6 (six) hours. Qty: 360 mL, Refills: 0    morphine 10 MG/5ML solution Take 2.5 mLs (5 mg total) by mouth every 3 (three) hours as needed. Qty: 50 mL, Refills: 0      CONTINUE these medications which have NOT CHANGED   Details  amitriptyline (ELAVIL) 50 MG tablet Take 50 mg by mouth at bedtime.    calcitonin, salmon, (MIACALCIN/FORTICAL) 200 UNIT/ACT nasal spray 1 spray at bedtime. Left nostril at 2130 every evening    furosemide (LASIX) 20 MG tablet Take 20 mg by mouth daily.    oxyCODONE (ROXICODONE) 15 MG immediate release tablet Take  15 mg by mouth every 4 (four) hours.     predniSONE (DELTASONE) 5 MG tablet Take 5 mg by mouth 2 (two) times daily with a meal.    lidocaine-prilocaine (EMLA) cream Apply to affected area once Qty: 30 g, Refills: 3   Associated Diagnoses: Squamous cell carcinoma of left lung (HCC)    ondansetron (ZOFRAN) 8 MG tablet Take 1 tablet (8 mg total) by mouth 2 (two) times daily as  needed for refractory nausea / vomiting. Start on day 3 after chemo. Qty: 60 tablet, Refills: 2   Associated Diagnoses: Squamous cell carcinoma of left lung (HCC)    prochlorperazine (COMPAZINE) 10 MG tablet TAKE 1 TABLET BY MOUTH EVERY 6 HOURS AS NEEDED Qty: 360 tablet, Refills: 2   Associated Diagnoses: Squamous cell carcinoma of left lung (HCC)      STOP taking these medications     methotrexate 50 MG/2ML injection         If you experience worsening of your admission symptoms, develop shortness of breath, life threatening emergency, suicidal or homicidal thoughts you must seek medical attention immediately by calling 911 or calling your MD immediately  if symptoms less severe.  You Must read complete instructions/literature along with all the possible adverse reactions/side effects for all the Medicines you take and that have been prescribed to you. Take any new Medicines after you have completely understood and accept all the possible adverse reactions/side effects.   Please note  You were cared for by a hospitalist during your hospital stay. If you have any questions about your discharge medications or the care you received while you were in the hospital after you are discharged, you can call the unit and asked to speak with the hospitalist on call if the hospitalist that took care of you is not available. Once you are discharged, your primary care physician will handle any further medical issues. Please note that NO REFILLS for any discharge medications will be authorized once you are discharged, as it is imperative that you return to your primary care physician (or establish a relationship with a primary care physician if you do not have one) for your aftercare needs so that they can reassess your need for medications and monitor your lab values. Today   SUBJECTIVE   anxious  VITAL SIGNS:  Blood pressure 115/72, pulse 71, temperature 97.7 F (36.5 C), temperature source Oral,  resp. rate (!) 28, height '5\' 11"'$  (1.803 m), weight 76 kg (167 lb 9.6 oz), SpO2 93 %.  I/O:   Intake/Output Summary (Last 24 hours) at 11/09/16 0738 Last data filed at 11/08/16 1915  Gross per 24 hour  Intake              720 ml  Output              350 ml  Net              370 ml    PHYSICAL EXAMINATION:  GENERAL:  76 y.o.-year-old patient lying in the bed with no acute distress. Chronically ill EYES: Pupils equal, round, reactive to light and accommodation. No scleral icterus. Extraocular muscles intact.  HEENT: Head atraumatic, normocephalic. Oropharynx and nasopharynx clear.  NECK:  Supple, no jugular venous distention. No thyroid enlargement, no tenderness.  LUNGS: Normal breath sounds bilaterally, no wheezing, rales,rhonchi or crepitation. No use of accessory muscles of respiration.  CARDIOVASCULAR: S1, S2 normal. No murmurs, rubs, or gallops.  ABDOMEN: Soft, non-tender, non-distended. Bowel sounds present. No  organomegaly or mass.  EXTREMITIES: No pedal edema, ++cyanosis, no clubbing.  NEUROLOGIC: Cranial nerves II through XII are intact. Muscle strength 5/5 in all extremities. Sensation intact. Gait not checked.  PSYCHIATRIC: The patient is alert and oriented x 3.  SKIN: No obvious rash, lesion, or ulcer.   DATA REVIEW:   CBC   Recent Labs Lab 11/07/16 0404  WBC 18.2*  HGB 11.9*  HCT 35.3*  PLT 417    Chemistries   Recent Labs Lab 11/03/16 0137  11/09/16 0450  NA 132*  --   --   K 4.4  --   --   CL 97*  --   --   CO2 28  --   --   GLUCOSE 324*  --   --   BUN 13  --   --   CREATININE 0.66  < > 0.69  CALCIUM 8.2*  --   --   < > = values in this interval not displayed.  Microbiology Results   Recent Results (from the past 240 hour(s))  Blood culture (routine x 2)     Status: Abnormal   Collection Time: 11/02/16  2:36 PM  Result Value Ref Range Status   Specimen Description BLOOD LEFT ARM  Final   Special Requests BACTEROIDES CACCAE (A)  Final   Culture  NO GROWTH 5 DAYS  Final   Report Status 11/07/2016 FINAL  Final  Blood culture (routine x 2)     Status: None   Collection Time: 11/02/16  2:36 PM  Result Value Ref Range Status   Specimen Description BLOOD RIGHT ARM  Final   Special Requests   Final    Blood Culture results may not be optimal due to an excessive volume of blood received in culture bottles   Culture NO GROWTH 5 DAYS  Final   Report Status 11/07/2016 FINAL  Final  Respiratory Panel by PCR     Status: None   Collection Time: 11/05/16  5:47 PM  Result Value Ref Range Status   Adenovirus NOT DETECTED NOT DETECTED Final   Coronavirus 229E NOT DETECTED NOT DETECTED Final   Coronavirus HKU1 NOT DETECTED NOT DETECTED Final   Coronavirus NL63 NOT DETECTED NOT DETECTED Final   Coronavirus OC43 NOT DETECTED NOT DETECTED Final   Metapneumovirus NOT DETECTED NOT DETECTED Final   Rhinovirus / Enterovirus NOT DETECTED NOT DETECTED Final   Influenza A NOT DETECTED NOT DETECTED Final   Influenza B NOT DETECTED NOT DETECTED Final   Parainfluenza Virus 1 NOT DETECTED NOT DETECTED Final   Parainfluenza Virus 2 NOT DETECTED NOT DETECTED Final   Parainfluenza Virus 3 NOT DETECTED NOT DETECTED Final   Parainfluenza Virus 4 NOT DETECTED NOT DETECTED Final   Respiratory Syncytial Virus NOT DETECTED NOT DETECTED Final   Bordetella pertussis NOT DETECTED NOT DETECTED Final   Chlamydophila pneumoniae NOT DETECTED NOT DETECTED Final   Mycoplasma pneumoniae NOT DETECTED NOT DETECTED Final    Comment: Performed at [facility], [address] [place], [place] [address]    RADIOLOGY:  Dg Chest 2 View  Result Date: 11/08/2016 CLINICAL DATA:  Continued cough and weakness. Pt was admitted 11/02/2016 for PNA and COPD exacerbation. Pt was at 40 office and was found to have O2 stats in the 80's. Hx - diagnosis squamous cell carcinoma of the left lung 10/16/2016, former smoker quit 2014, smoked 1 ppd x 50 yrs. EXAM: CHEST  2 VIEW  COMPARISON:  Chest radiograph, 11/06/2016. Chest CT,  11/02/2016 and prior chest radiographs. FINDINGS: Hazy opacity superimposed on extensive interstitial fibrosis is similar to the prior study, most evident in both lower lobes, right middle lobe and left upper lobe lingula. Ill-defined mass in the anterior left upper lobe is stable. No new lung abnormalities.  No pleural effusion.  No pneumothorax. Cardiac silhouette is normal in size. No convincing mediastinal or hilar masses. Skeletal structures are unremarkable. IMPRESSION: 1. No convincing change from the most recent chest radiograph. 2. Hazy airspace opacity consistent with pneumonia or pneumonitis is superimposed on extensive interstitial fibrosis, most evident in the lower lobes, right middle lobe and left upper lobe lingula, seen to a lesser degree in the right upper lobe. Electronically Signed   By: Lajean Manes M.D.   On: 11/08/2016 10:27     Management plans discussed with the patient, family and they are in agreement.  CODE STATUS:     Code Status Orders        Start     Ordered   11/06/16 1242  Do not attempt resuscitation (DNR)  Continuous    Question Answer Comment  In the event of cardiac or respiratory ARREST Do not call a "code blue"   In the event of cardiac or respiratory ARREST Do not perform Intubation, CPR, defibrillation or ACLS   In the event of cardiac or respiratory ARREST Use medication by any route, position, wound care, and other measures to relive pain and suffering. May use oxygen, suction and manual treatment of airway obstruction as needed for comfort.      11/06/16 1241    Code Status History    Date Active Date Inactive Code Status Order ID Comments User Context   11/02/2016  4:56 PM 11/06/2016 12:41 PM Full Code [id number]  Dustin Flock, MD Inpatient   10/19/2016  1:28 PM 10/20/2016  3:17 PM Full Code [id number]  Margaretha Sheffield, MD Inpatient      TOTAL TIME TAKING CARE OF THIS PATIENT: 40 minutes.     Alroy Portela M.D on 11/09/2016 at 7:38 AM  Between 7am to 6pm - Pager - [phone] After 6pm go to [email or web address] - password [email or web address] [facility]  Office  [phone]  CC: Primary care physician; Hortencia Pilar, MD

## 2016-11-09 NOTE — Care Management (Signed)
Discharge to home today per Dr. Fritzi Mandes. Will be followed by [facility]. Doreatha Lew RN representative for [facility] updated. Will need equipment in the home. Transportation arrangements will be arranged per [employer]. [place] [facility] Care Management [phone]

## 2016-11-09 NOTE — Progress Notes (Signed)
Visited with patient and family.  States relief from prn Xanax and Roxanol. Plan is home today with hospice services. Answered questions and concerns.    NO CHARGE  Ihor Dow, FNP-C Palliative Medicine Team  Phone: [phone] Fax: [phone]

## 2016-11-09 NOTE — Progress Notes (Signed)
Pt to be discharged home with hospice.  Alert. 02 6l [place]. Instructions discussed with pt and wife. presc  Given and discussed and home meds discussed.  Kara from  Hospice spoke with pts wife re meds  Pt to pick meds  Up at [facility] in [place]. [facility] called to transport pt.

## 2016-11-09 NOTE — Consult Note (Signed)
New referral for hospice at home on 76 yo gentleman who was admitted to [facility] on 4.23.18 with multifocal pneumonia, COPD exacerbation r/t pulmonary fibrosis, and new dx squamous cell left lung cancer.  Hospice referral was sent over the weekend.   Histoy of RA, Osteoporosis, epiglottis cancer and GERD.  Patient is post palliative medicine consult and elects hospice services upon discharge from [facility] and DNR inplace.  Patient continues to have significant shortness of breath and is on 6L oxygen via [place].  He is currently on roxanol and xanax for dyspnea.  Patient will need oxygen, hospital bed, BSC and nebulizer at discharge.  Patient with plans to discharge home today after equipment delivered.  Referral information faxed to referral intake and DME ordered.  Patient was prior [facility] referral, but will now plan on discharging with hospice services.  Thank you for allowing participation in this patient's care.  Dimas Aguas, RN Clinical Nurse Liaison [facility] [phone]

## 2016-11-09 NOTE — Discharge Instructions (Signed)
Acute Respiratory Distress Syndrome, Adult °Acute respiratory distress syndrome is a life-threatening condition in which fluid collects in the lungs. This prevents the lungs from filling with air and passing oxygen into the blood. This can cause the lungs and other vital organs to fail. The condition usually develops following an infection, illness, surgery, or injury. °What are the causes? °This condition may be caused by: °· An infection, such as sepsis or pneumonia. °· A serious injury to the head or chest. °· Severe bleeding from an injury. °· A major surgery. °· Breathing in harmful chemicals or smoke. °· Blood transfusions. °· A blood clot in the lungs. °· Breathing in vomit (aspiration). °· Near-drowning. °· Inflammation of the pancreas (pancreatitis). °· A drug overdose. °What are the signs or symptoms? °Sudden shortness of breath and rapid breathing are the main symptoms of this condition. Other symptoms may include: °· A fast or irregular heartbeat. °· Skin, lips, or fingernails that look blue (cyanosis). °· Confusion. °· Tiredness or loss of energy. °· Chest pain, particularly while taking a breath. °· Coughing. °· Restlessness or anxiety. °· Fever. This is usually present if there is an underlying infection, such as pneumonia. °How is this diagnosed? °This condition is diagnosed based on: °· Your symptoms. °· Medical history. °· A physical exam. During the exam, your health care provider will listen to your heart and check for crackling or wheezing sounds in your lungs. °You may also have other tests to confirm the diagnosis and measure how well your lungs are working. These may include: °· Measuring the amount of oxygen in your blood. Your health care provider will use two methods to do this procedure: °¨ A small device (pulse oximeter) that is placed on your finger, earlobe, or toe. °¨ An arterial blood gas test. A sample of blood is taken from an artery and tested for oxygen levels. °· Blood  tests. °· Chest X-rays or CT scans to look for fluid in the lungs. °· Taking a sample of your sputum to test for infection. °· Heart test, such as an echocardiogram or electrocardiogram. This is done to rule out any heart problems (such as heart failure) that may be causing your symptoms. °· Bronchoscopy. During this test, a thin, flexible tube with a light is passed into the mouth or nose, down the windpipe, and into the lungs. °How is this treated? °Treatment depends on the cause of your condition. The goal is to support you while your lungs heal and the underlying cause is treated. Treatment may include: °· Oxygen therapy. This may be done through: °¨ A tube in your nose or a face mask. °¨ A ventilator. This device helps move air into and out of your lungs through a breathing tube that is inserted into your mouth or nose. °· Continuous positive airway pressure (CPAP). This treatment uses mild air pressure to keep the airways open. A mask or other device will be placed over your nose or mouth. °· Tracheostomy. During this procedure, a small cut is made in your neck to create an opening to your windpipe. A breathing tube is placed directly into your windpipe. The breathing tube is connected to a ventilator. This is done if you have problems with your airway or if you need a ventilator for a long period of time. °· Positioning you to lie on your stomach (prone position). °· Medicines, such as: °¨ Sedatives to help you relax. °¨ Blood pressure medicines. °¨ Antibiotics to treat infection. °¨ Blood thinners   to prevent blood clots.  Diuretics to help prevent excess fluid.  Fluids and nutrients given through an IV tube.  Wearing compression stockings on your legs to prevent blood clots.  Extra corporeal membrane oxygenation (ECMO). This treatment takes blood outside your body, adds oxygen, and removes carbon dioxide. The blood is then returned to your body. This treatment is only used in severe cases. Follow  these instructions at home:  Take over-the-counter and prescription medicines only as told by your health care provider.  Do not use any products that contain nicotine or tobacco, such as cigarettes and e-cigarettes. If you need help quitting, ask your health care provider.  Limit alcohol intake to no more than 1 drink per day for nonpregnant women and 2 drinks per day for men. One drink equals 12 oz of beer, 5 oz of wine, or 1 oz of hard liquor.  Ask friends and family to help you if daily activities make you tired.  Attend any pulmonary rehabilitation as told by your health care provider. This may include:  Education about your condition.  Exercises.  Breathing training.  Counseling.  Learning techniques to conserve energy.  Nutrition counseling.  Keep all follow-up visits as told by your health care provider. This is important. Contact a health care provider if:  You become short of breath during activity or while resting.  You develop a cough that does not go away.  You have a fever.  Your symptoms do not get better or they get worse.  You become anxious or depressed. Get help right away if:  You have sudden shortness of breath.  You develop sudden chest pain that does not go away.  You develop a rapid heart rate.  You develop swelling or pain in one of your legs.  You cough up blood.  You have trouble breathing.  Your skin, lips, or fingernails turn blue. These symptoms may represent a serious problem that is an emergency. Do not wait to see if the symptoms will go away. Get medical help right away. Call your local emergency services (911 in the [place] Do not drive yourself to the hospital. Summary  Acute respiratory distress syndrome is a life-threatening condition in which fluid collects in the lungs, which leads the lungs and other vital organs to fail.  This condition usually develops following an infection, illness, surgery, or injury.  Sudden  shortness of breath and rapid breathing are the main symptoms of acute respiratory distress syndrome.  Treatment may include oxygen therapy, continuous positive airway pressure (CPAP), tracheostomy, lying on your stomach (prone position), medicines, fluids and nutrients given through an IV tube, compression stockings, and extra corporeal membrane oxygenation (ECMO). This information is not intended to replace advice given to you by your health care provider. Make sure you discuss any questions you have with your health care provider. Document Released: 06/29/2005 Document Revised: 06/15/2016 Document Reviewed: 06/15/2016 Elsevier Interactive Patient Education  2017 [employer].   Acute Respiratory Failure, Adult Acute respiratory failure occurs when there is not enough oxygen passing from your lungs to your body. When this happens, your lungs have trouble removing carbon dioxide from the blood. This causes your blood oxygen level to drop too low as carbon dioxide builds up. Acute respiratory failure is a medical emergency. It can develop quickly, but it is temporary if treated promptly. Your lung capacity, or how much air your lungs can hold, may improve with time, exercise, and treatment. What are the causes? There are many possible  causes of acute respiratory failure, including:  Lung injury.  Chest injury or damage to the ribs or tissues near the lungs.  Lung conditions that affect the flow of air and blood into and out of the lungs, such as pneumonia, acute respiratory distress syndrome, and cystic fibrosis.  Medical conditions, such as strokes or spinal cord injuries, that affect the muscles and nerves that control breathing.  Blood infection (sepsis).  Inflammation of the pancreas (pancreatitis).  A blood clot in the lungs (pulmonary embolism).  A large-volume blood transfusion.  Burns.  Near-drowning.  Seizure.  Smoke inhalation.  Reaction to medicines.  Alcohol or  drug overdose. What increases the risk? This condition is more likely to develop in people who have:  A blocked airway.  Asthma.  A condition or disease that damages or weakens the muscles, nerves, bones, or tissues that are involved in breathing.  A serious infection.  A health problem that blocks the unconscious reflex that is involved in breathing, such as hypothyroidism or sleep apnea.  A lung injury or trauma. What are the signs or symptoms? Trouble breathing is the main symptom of acute respiratory failure. Symptoms may also include:  Rapid breathing.  Restlessness or anxiety.  Skin, lips, or fingernails that appear blue (cyanosis).  Rapid heart rate.  Abnormal heart rhythms (arrhythmias).  Confusion or changes in behavior.  Tiredness or loss of energy.  Feeling sleepy or having a loss of consciousness. How is this diagnosed? Your health care provider can diagnose acute respiratory failure with a medical history and physical exam. During the exam, your health care provider will listen to your heart and check for crackling or wheezing sounds in your lungs. Your may also have tests to confirm the diagnosis and determine what is causing respiratory failure. These tests may include:  Measuring the amount of oxygen in your blood (pulse oximetry). The measurement comes from a small device that is placed on your finger, earlobe, or toe.  Other blood tests to measure blood gases and to look for signs of infection.  Sampling your cerebral spinal fluid or tracheal fluid to check for infections.  Chest X-ray to look for fluid in spaces that should be filled with air.  Electrocardiogram (ECG) to look at the heart's electrical activity. How is this treated? Treatment for this condition usually takes places in a hospital intensive care unit (ICU). Treatment depends on what is causing the condition. It may include one or more treatments until your symptoms improve. Treatment may  include:  Supplemental oxygen. Extra oxygen is given through a tube in the nose, a face mask, or a hood.  A device such as a continuous positive airway pressure (CPAP) or bi-level positive airway pressure (BiPAP or BPAP) machine. This treatment uses mild air pressure to keep the airways open. A mask or other device will be placed over your nose or mouth. A tube that is connected to a motor will deliver oxygen through the mask.  Ventilator. This treatment helps move air into and out of the lungs. This may be done with a bag and mask or a machine. For this treatment, a tube is placed in your windpipe (trachea) so air and oxygen can flow to the lungs.  Extracorporeal membrane oxygenation (ECMO). This treatment temporarily takes over the function of the heart and lungs, supplying oxygen and removing carbon dioxide. ECMO gives the lungs a chance to recover. It may be used if a ventilator is not effective.  Tracheostomy. This is a procedure  that creates a hole in the neck to insert a breathing tube.  Receiving fluids and medicines.  Rocking the bed to help breathing. Follow these instructions at home:  Take over-the-counter and prescription medicines only as told by your health care provider.  Return to normal activities as told by your health care provider. Ask your health care provider what activities are safe for you.  Keep all follow-up visits as told by your health care provider. This is important. How is this prevented? Treating infections and medical conditions that may lead to acute respiratory failure can help prevent the condition from developing. Contact a health care provider if:  You have a fever.  Your symptoms do not improve or they get worse. Get help right away if:  You are having trouble breathing.  You lose consciousness.  Your have cyanosis or turn blue.  You develop a rapid heart rate.  You are confused. These symptoms may represent a serious problem that is an  emergency. Do not wait to see if the symptoms will go away. Get medical help right away. Call your local emergency services (911 in the [place] Do not drive yourself to the hospital. This information is not intended to replace advice given to you by your health care provider. Make sure you discuss any questions you have with your health care provider. Document Released: 07/04/2013 Document Revised: 01/25/2016 Document Reviewed: 01/15/2016 Elsevier Interactive Patient Education  2017 [place] Pneumonia, Adult Pneumonia is an infection of the lungs. One type of pneumonia can happen while a person is in a hospital. A different type can happen when a person is not in a hospital (community-acquired pneumonia). It is easy for this kind to spread from person to person. It can spread to you if you breathe near an infected person who coughs or sneezes. Some symptoms include:  A dry cough.  A wet (productive) cough.  Fever.  Sweating.  Chest pain. Follow these instructions at home:  Take over-the-counter and prescription medicines only as told by your doctor.  Only take cough medicine if you are losing sleep.  If you were prescribed an antibiotic medicine, take it as told by your doctor. Do not stop taking the antibiotic even if you start to feel better.  Sleep with your head and neck raised (elevated). You can do this by putting a few pillows under your head, or you can sleep in a recliner.  Do not use tobacco products. These include cigarettes, chewing tobacco, and e-cigarettes. If you need help quitting, ask your doctor.  Drink enough water to keep your pee (urine) clear or pale yellow. A shot (vaccine) can help prevent pneumonia. Shots are often suggested for:  People older than 75 years of age.  People older than 75 years of age:  Who are having cancer treatment.  Who have long-term (chronic) lung disease.  Who have problems with their body's defense  system (immune system). You may also prevent pneumonia if you take these actions:  Get the flu (influenza) shot every year.  Go to the dentist as often as told.  Wash your hands often. If soap and water are not available, use hand sanitizer. Contact a doctor if:  You have a fever.  You lose sleep because your cough medicine does not help. Get help right away if:  You are short of breath and it gets worse.  You have more chest pain.  Your sickness gets worse. This is very serious if:  You  are an older adult.  Your body's defense system is weak.  You cough up blood. This information is not intended to replace advice given to you by your health care provider. Make sure you discuss any questions you have with your health care provider. Document Released: 12/16/2007 Document Revised: 12/05/2015 Document Reviewed: 10/24/2014 Elsevier Interactive Patient Education  2017 [employer].

## 2016-11-11 ENCOUNTER — Ambulatory Visit: Payer: Medicare Other

## 2016-11-11 ENCOUNTER — Inpatient Hospital Stay: Payer: Medicare Other

## 2016-11-11 ENCOUNTER — Inpatient Hospital Stay: Payer: Medicare Other | Admitting: Oncology

## 2016-11-11 ENCOUNTER — Ambulatory Visit: Payer: Medicare Other | Admitting: Oncology

## 2016-11-11 ENCOUNTER — Other Ambulatory Visit: Payer: Medicare Other

## 2016-11-12 ENCOUNTER — Other Ambulatory Visit: Payer: Self-pay

## 2016-11-12 ENCOUNTER — Other Ambulatory Visit: Payer: Medicare Other

## 2016-11-19 ENCOUNTER — Other Ambulatory Visit: Payer: Self-pay | Admitting: Oncology

## 2016-11-19 DIAGNOSIS — C3492 Malignant neoplasm of unspecified part of left bronchus or lung: Secondary | ICD-10-CM

## 2016-11-20 ENCOUNTER — Telehealth: Payer: Self-pay | Admitting: *Deleted

## 2016-11-20 ENCOUNTER — Other Ambulatory Visit: Payer: Self-pay | Admitting: *Deleted

## 2016-11-20 MED ORDER — MORPHINE SULFATE (CONCENTRATE) 20 MG/ML PO SOLN: 5.0000 mg | ORAL | 0 refills | Status: DC | PRN

## 2016-11-20 MED ORDER — MORPHINE SULFATE 10 MG/5ML PO SOLN: 5.0000 mg | ORAL | 0 refills | Status: DC | PRN

## 2016-11-20 NOTE — Telephone Encounter (Signed)
Pharmacy called requesting change in medication concentration of morphine due to shortage of the ordered doe. Morphine changed to Morphine '20mg'$ /ml take 0.25 mLs by mouth every 3 hours as needed.

## 2016-11-20 NOTE — Telephone Encounter (Signed)
Notified [facility] that refill has been faxed.

## 2016-11-23 ENCOUNTER — Telehealth: Payer: Self-pay | Admitting: *Deleted

## 2016-11-23 NOTE — Telephone Encounter (Signed)
Pt's wife called to inform us that pt will be transferred to hospice care in [place] and will need order for additional oxygen tanks for trip to [place]. Order for #4 oxygen cylinders faxed to [facility] at [phone]. Instructed pt's wife to let us know if needs anything else. She wanted to thank Dr. Grayland Ormond and his staff for all the help provided to them during this time.

## 2016-11-26 NOTE — Telephone Encounter (Signed)
Opened in error; duplicate.

## 2016-12-11 DEATH — deceased

## 2017-05-22 IMAGING — MR MR LUMBAR SPINE W/O CM
4 of 5 series · 23 of 48 positions shown · non-contrast
Comparison: [REDACTED] lumbar radiographs 09/14/2016 report
(no images available).

CLINICAL DATA: With lumbar back pain for 1 month with no known
injury. Pain radiating to the right leg and foot. Initial encounter.

EXAM:
MRI LUMBAR SPINE WITHOUT CONTRAST
TECHNIQUE: Multiplanar, multisequence MR imaging of the lumbar spine was
performed. No intravenous contrast was administered.

[Series 2: T2 · sagittal · 4.0mm · 0.81mm/px · 6 of 15 slices shown (1 of 2)]
[im 1/15]
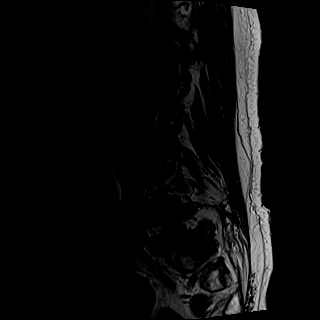
[im 3/15]
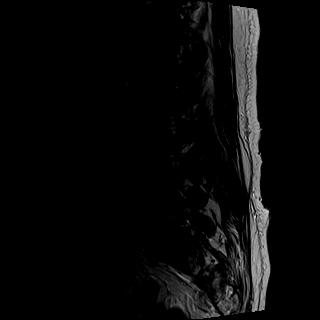
[im 6/15]
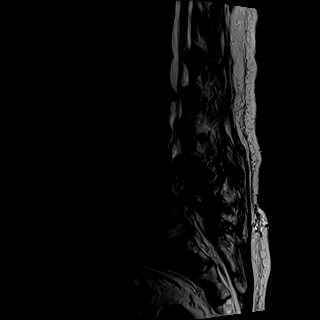
[im 9/15]
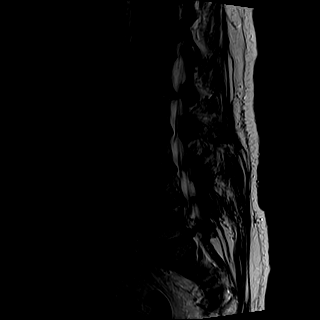
[im 12/15]
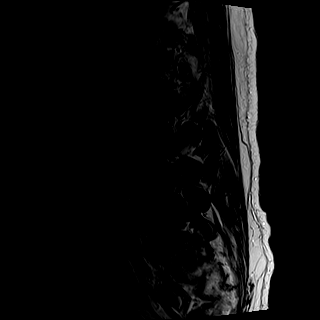
[im 15/15]
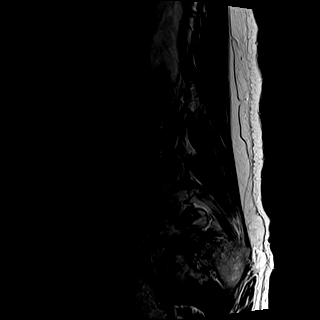

[Series 3: T1 · sagittal · 4.0mm · 0.41mm/px · 5 of 15 slices shown (1 of 2)]
[im 1/15]
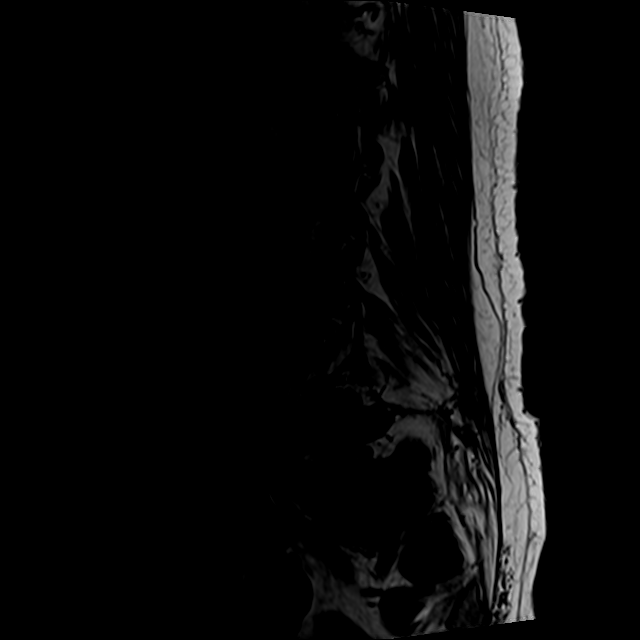
[im 3/15]
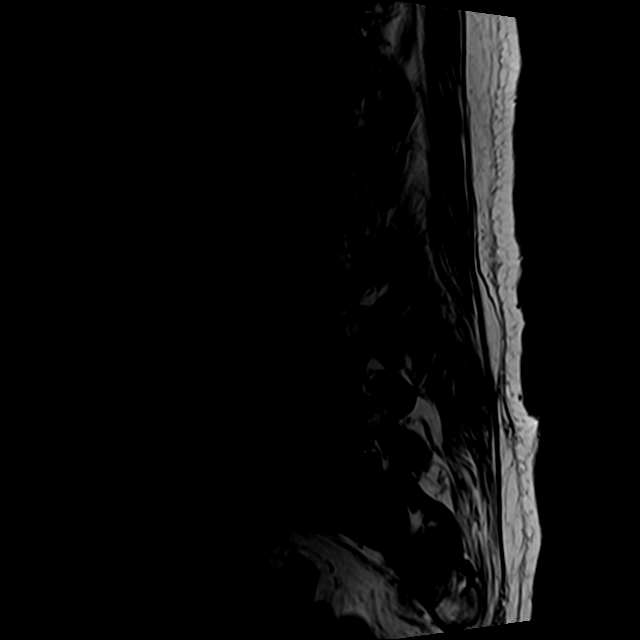
[im 6/15]
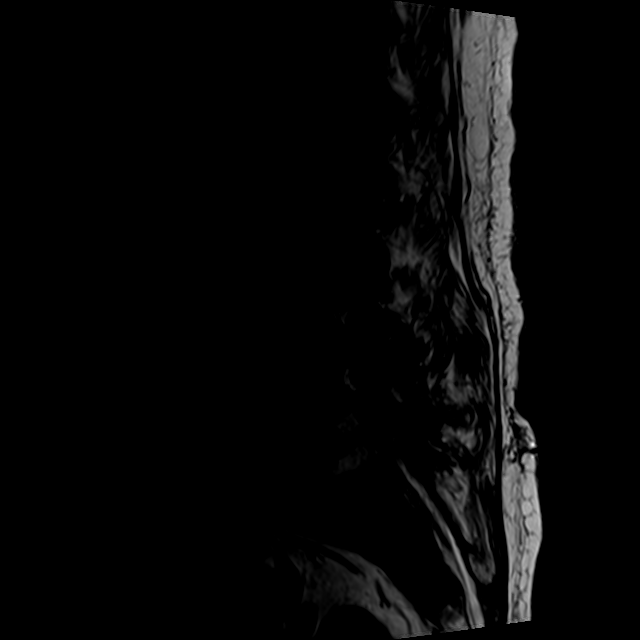
[im 9/15]
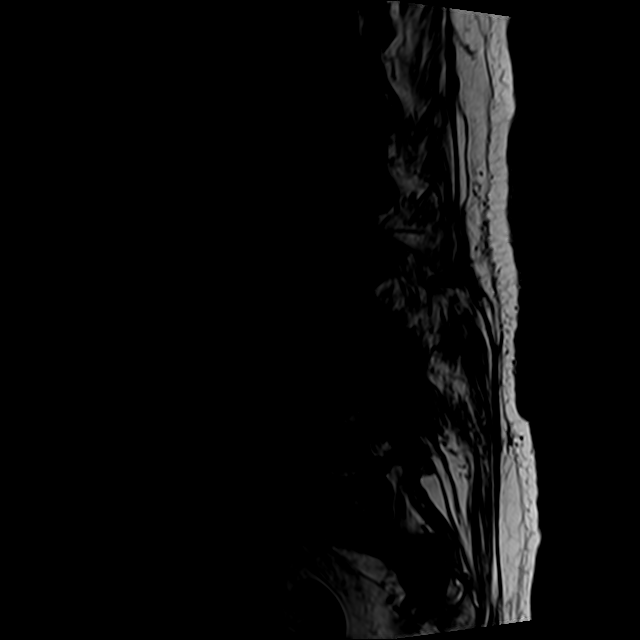
[im 15/15]
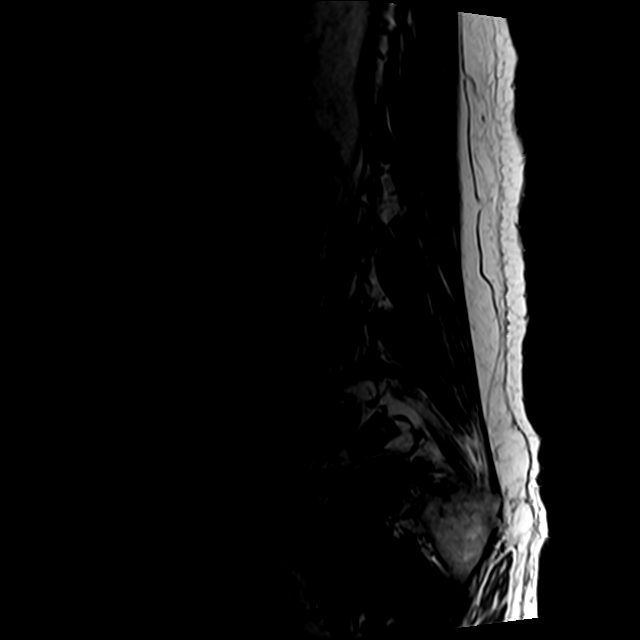

[Series 5: T2 · axial · 4.0mm · 0.78mm/px · z∈[-64,+124]mm · 9 of 36 slices shown (2 of 2)]
[im 1/36]
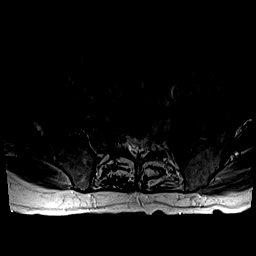
[im 6/36]
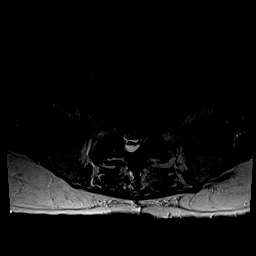
[im 11/36]
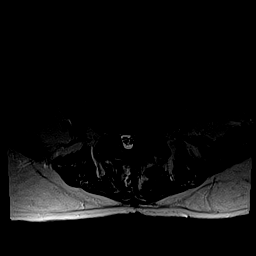
[im 16/36]
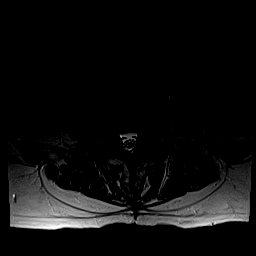
[im 18/36]
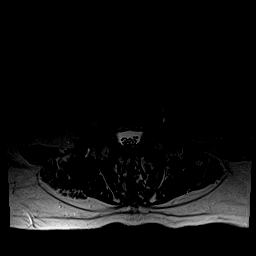
[im 21/36]
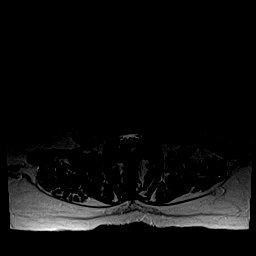
[im 26/36]
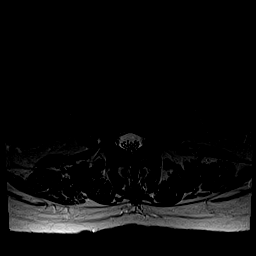
[im 31/36]
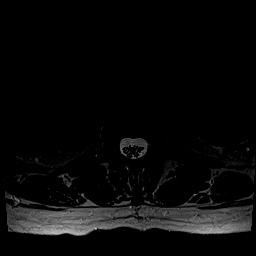
[im 36/36]
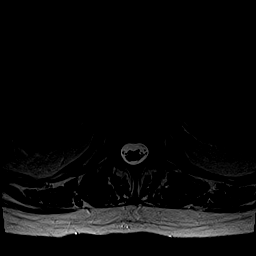

[Series 6: T1 · axial · 4.0mm · 0.31mm/px · z∈[-39,+99]mm · 3 of 36 slices shown (2 of 2)]
[im 6/36]
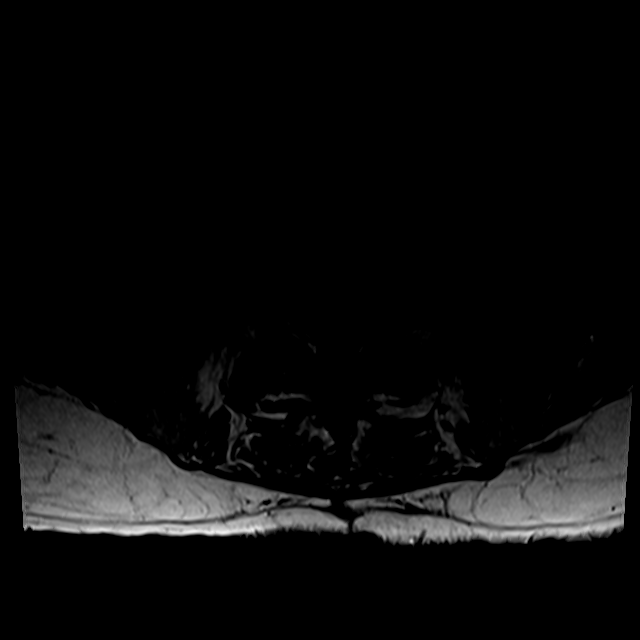
[im 18/36]
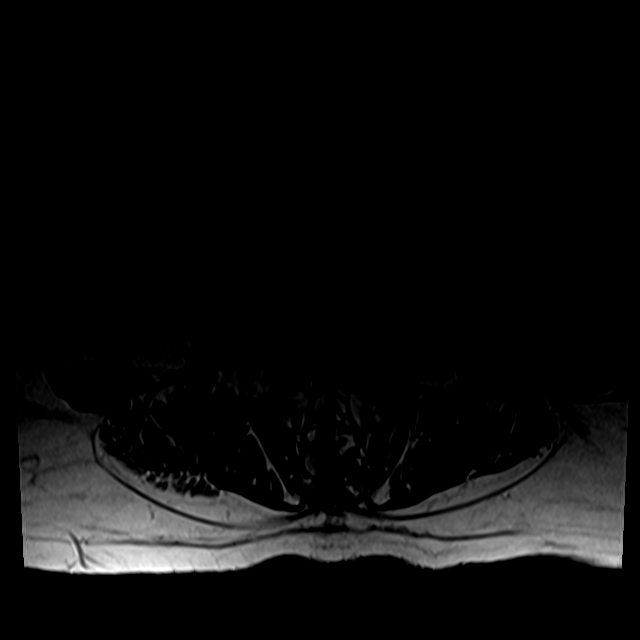
[im 31/36]
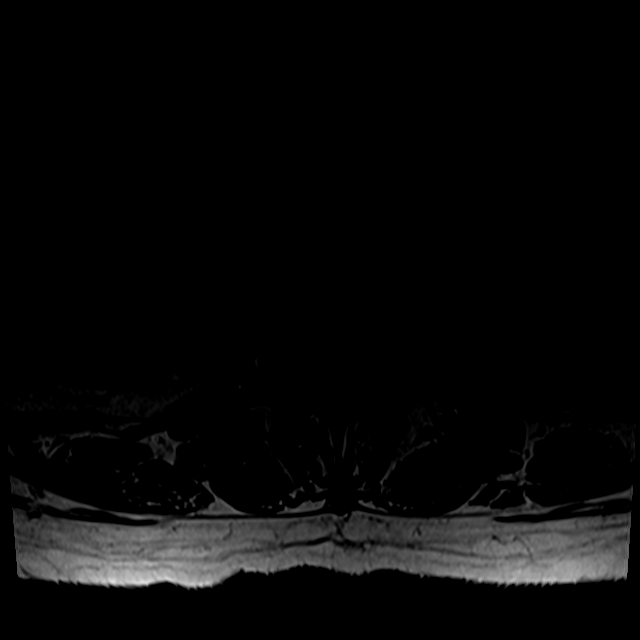

[23 of 48 positions shown; findings below may reference images not displayed]

FINDINGS: Segmentation: Lumbar segmentation appears to be normal and will be
designated as such for this report.

Alignment: Mild straightening of lumbar lordosis. Mild
retrolisthesis at both L1-L2 and L5-S1.

Vertebrae: Degenerative endplate marrow changes are noted in the
lumbar spine especially at L4-L5 and L5-S1 where previous surgical
changes are noted. There is very low level edema in the posterior L4
and L5 vertebrae which is felt to be degenerative in nature.

However, there is severe marrow edema throughout much of the visible
sacrum (series 6, image 36 and series 4, image 10). This appears to
be left greater than right with confluent sacral ala involvement.
Confluent central S2 and S3 involvement is partially visible. There
is mild associated presacral stranding or trace presacral fluid. The
SI joints do not appear eroded, in the visible medial iliac bones
appear spared.

Conus medullaris: Extends to the L1 level and appears normal.

Paraspinal and other soft tissues: Negative visualized abdominal
viscera. Postoperative changes to the lower lumbar paraspinal soft
tissues described below.

Disc levels:

L1-L2: Severe disc space loss with circumferential disc osteophyte
complex and broad-based posterior component. Mild facet and ligament
flavum hypertrophy. Mild spinal stenosis. Moderate to severe left
and mild right L1 foraminal stenosis.

L2-L3: Circumferential disc bulge. Mild facet and ligament flavum
hypertrophy. No significant stenosis.

L3-L4: Posterior disc space loss. Circumferential disc osteophyte
complex. Postoperative changes to the lamina with up to moderate
residual facet hypertrophy greater on the left. Mild bilateral L3
foraminal stenosis.

L4-L5: Severe disc space loss with bulky left eccentric
circumferential disc osteophyte complex. Postoperative changes to
the lamina. Moderate residual facet hypertrophy greater on the left.
No spinal stenosis but there is moderate to severe left lateral
recess stenosis (descending left L5 nerve root level) and mild
bilateral L4 foraminal stenosis.

L5-S1: Severe disc space loss. Bulky circumferential disc osteophyte
complex with broad-based posterior component. Moderate facet
hypertrophy greater on the right. Trace right facet joint fluid.
Mild spinal and bilateral L5 foraminal stenosis.
IMPRESSION: 1. Severe edema in the visible sacrum. Although not completely
visualized I favor this reflects left worse than right acute sacral
insufficiency fractures. Pelvis CT or MRI should confirm if the
diagnosis is in doubt.
2. Widespread lumbar spine degeneration with postoperative changes
to the posterior elements L3-L4 through L5-S1. Moderate to severe
residual left lateral recess stenosis at L4-L5. Mild spinal stenosis
and moderate to severe left foraminal stenosis at L1-L2.

## 2017-07-07 IMAGING — CR DG CHEST 2V
2 series · 2 of 2 positions shown · non-contrast
Comparison: Chest radiograph September 25, 2016 and chest radiograph
October 14, 2016

CLINICAL DATA: Shortness of breath.  Left-sided lung carcinoma

EXAM:
CHEST  2 VIEW

[chest pa]
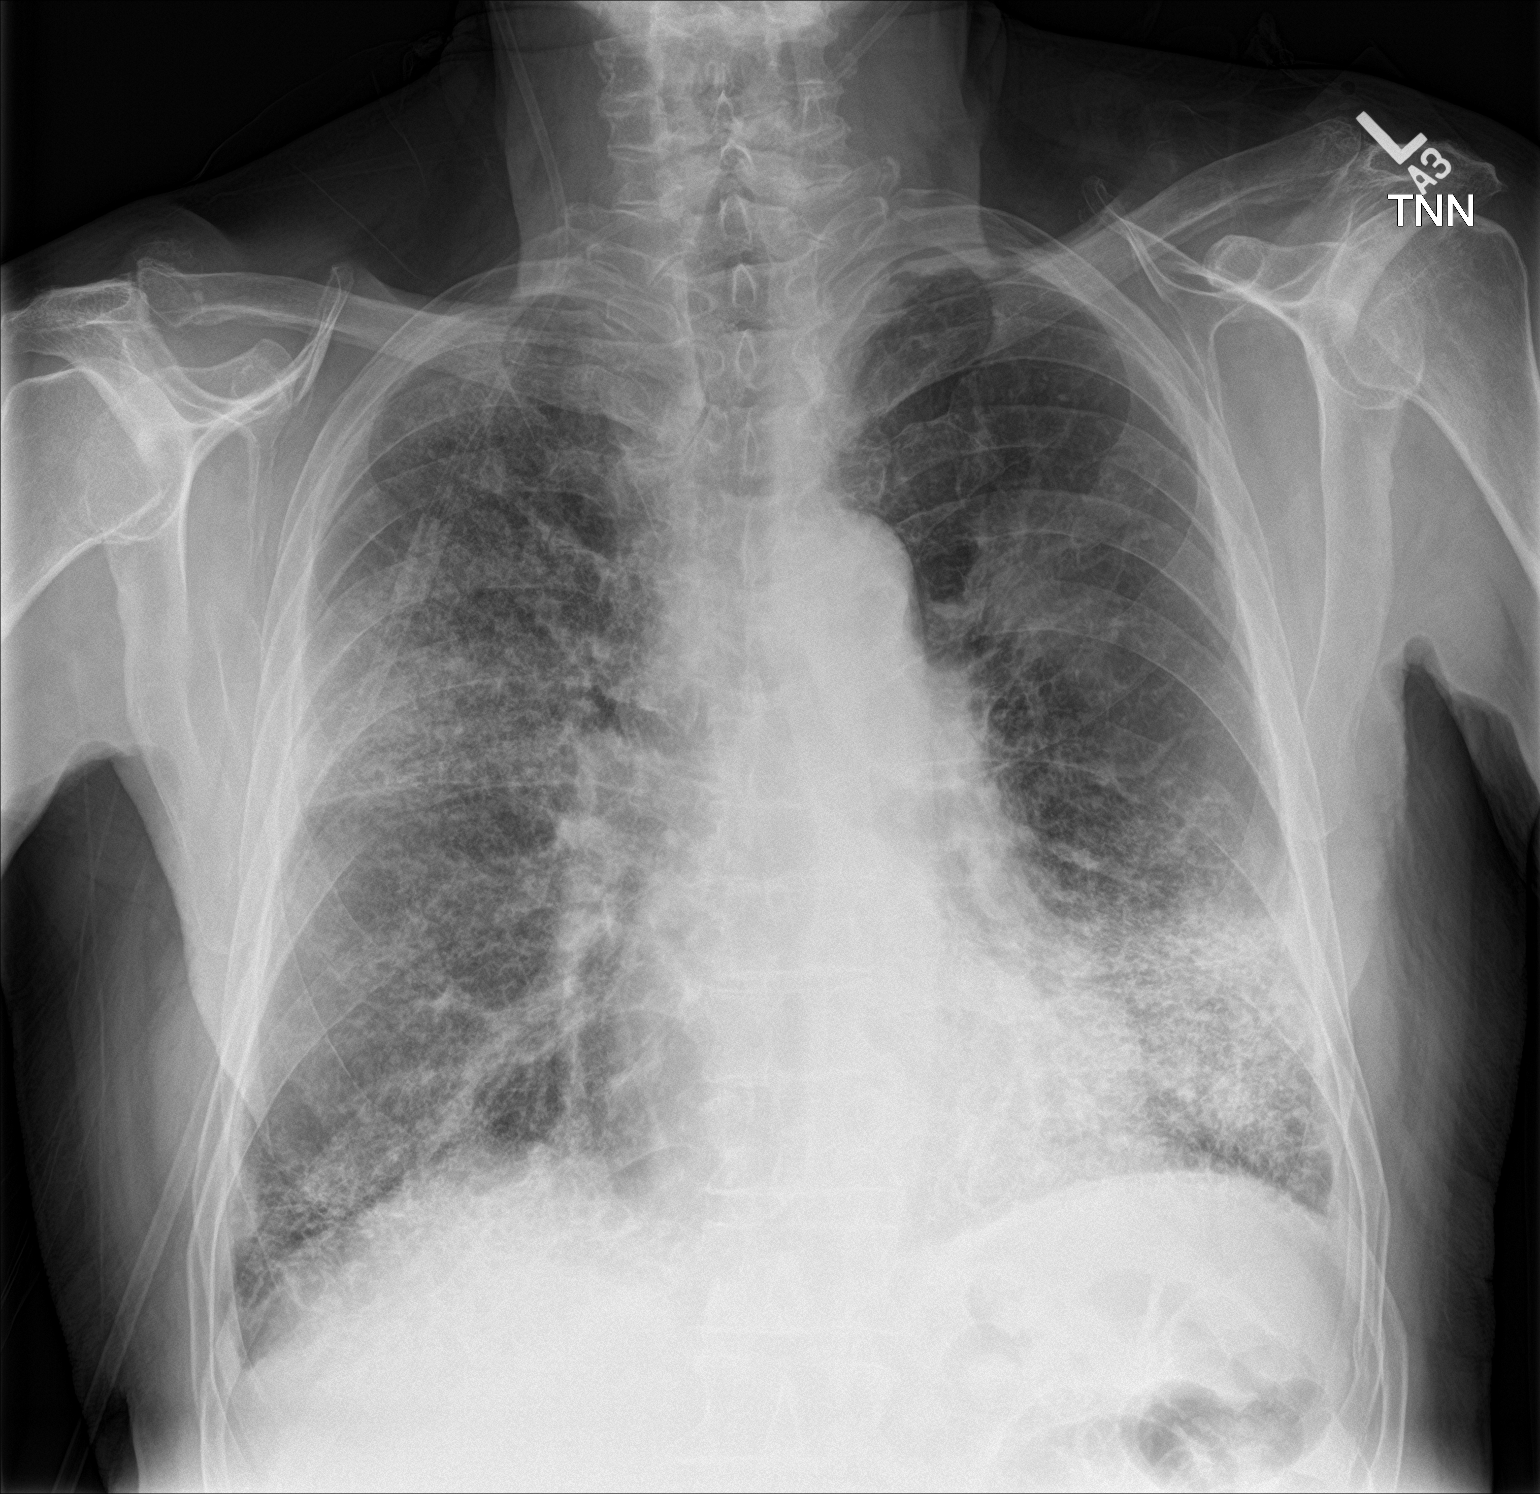

[chest lat]
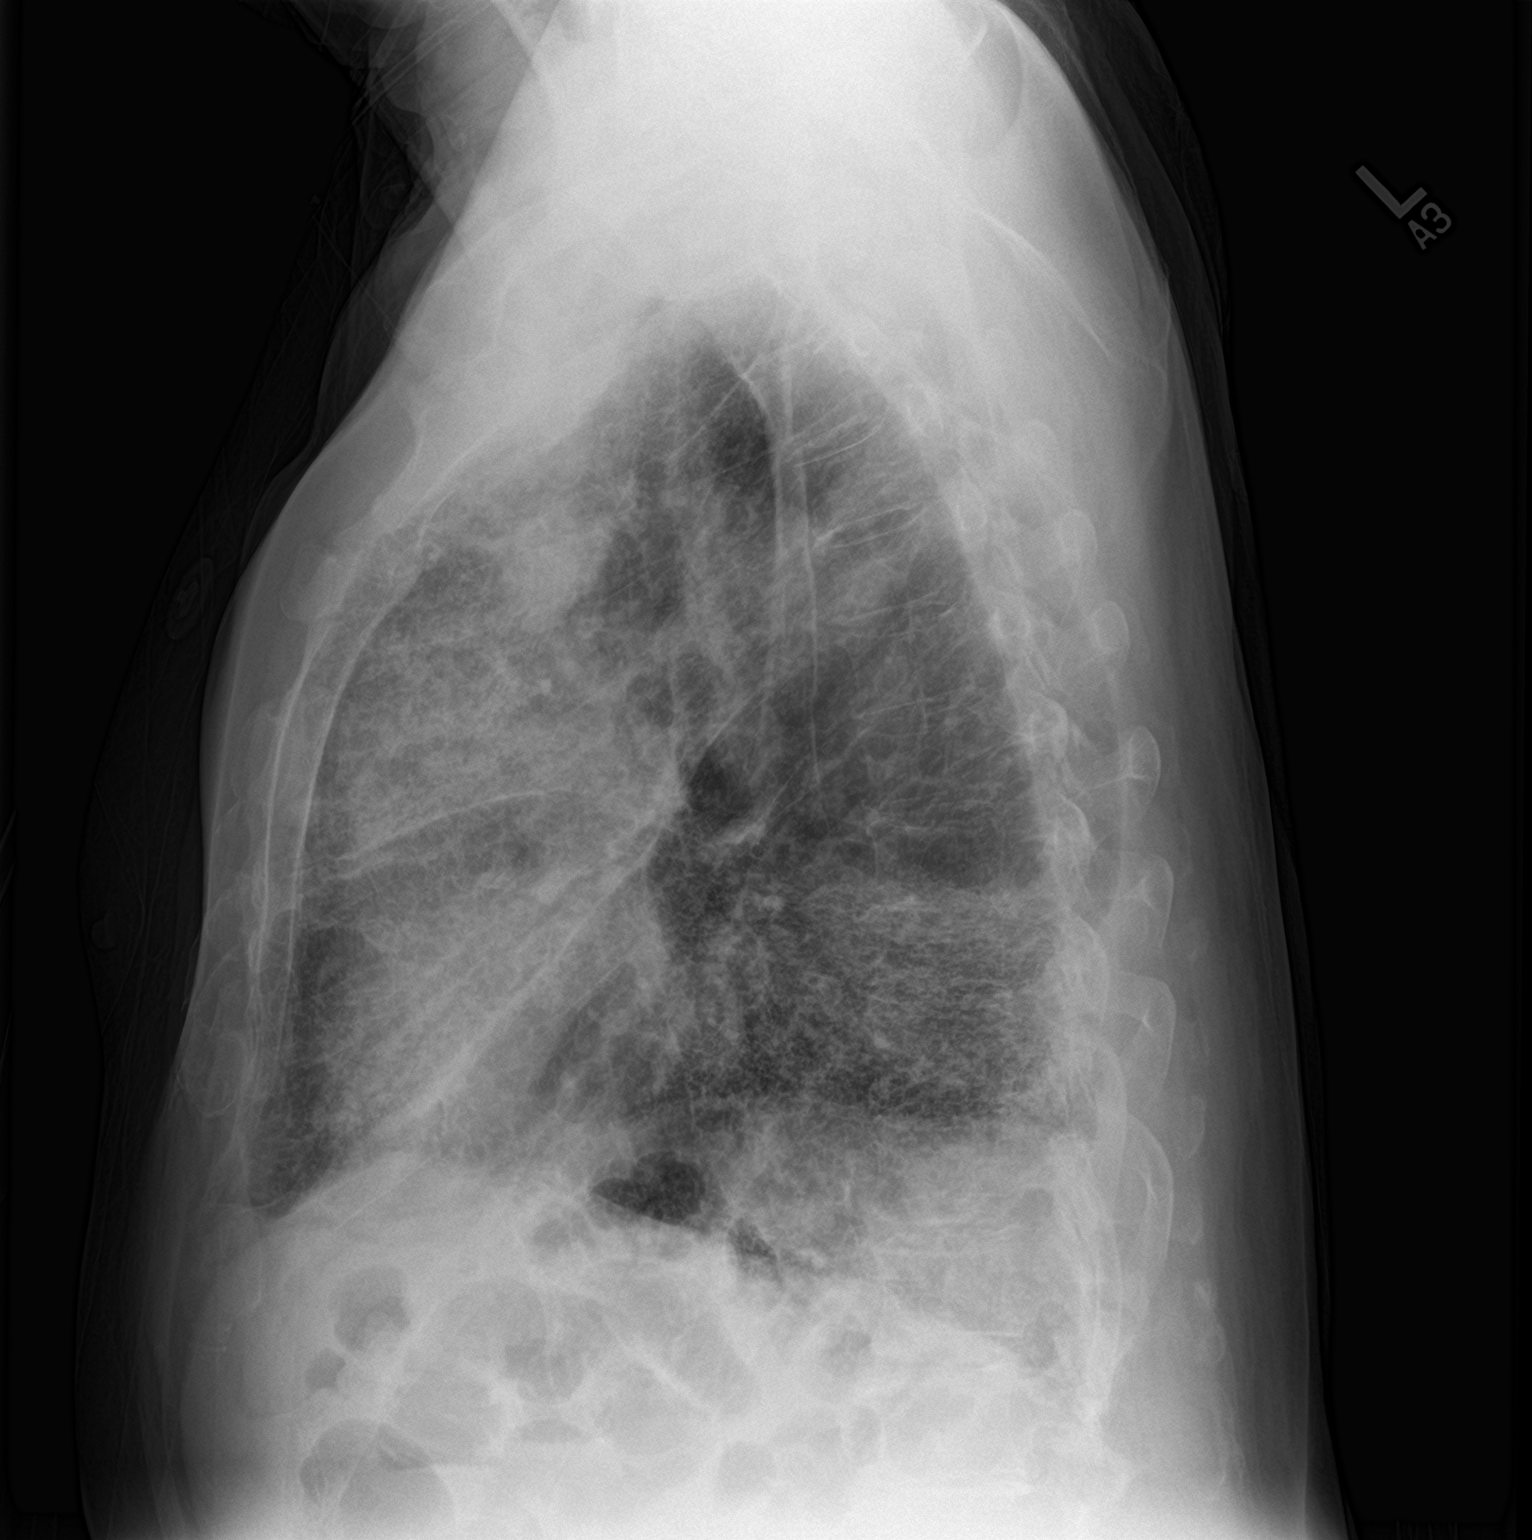

[2 of 2 positions shown; findings below may reference images not displayed]

FINDINGS: There is a mass in the anterior segment of the left upper lobe
measuring 3.7 x 3.5 x 2.6 cm. There is underlying interstitial
fibrosis. There is persistent patchy airspace consolidation in the
lung bases bilaterally, more pronounced on the left than on the
right. There is new patchy airspace consolidation in the anterior
segment of the right upper lobe.

Heart size and pulmonary vascularity are normal. No adenopathy. No
bone lesions.
IMPRESSION: New airspace consolidation consistent with pneumonia anterior
segment right upper lobe. Persistent mass left upper lobe, not
appreciably changed. Areas of patchy airspace consolidation in both
lung bases, more on the left than on the right, stable. Underlying
interstitial fibrosis in the bases. Stable cardiac silhouette. No
adenopathy demonstrable.

## 2017-07-13 IMAGING — CR DG CHEST 2V
2 series · 2 of 2 positions shown · non-contrast
Comparison: Chest radiograph, 11/06/2016. Chest CT, 11/02/2016 and
prior chest radiographs.

CLINICAL DATA: Continued cough and weakness. Pt was admitted
11/02/2016 for PNA and COPD exacerbation. Pt was at physician's
office and was found to have O2 stats in the 80's. Hx - diagnosis
squamous cell carcinoma of the left lung 10/16/2016, former smoker
quit [id number], smoked 1 ppd x 50 yrs.

EXAM:
CHEST  2 VIEW

[chest pa]
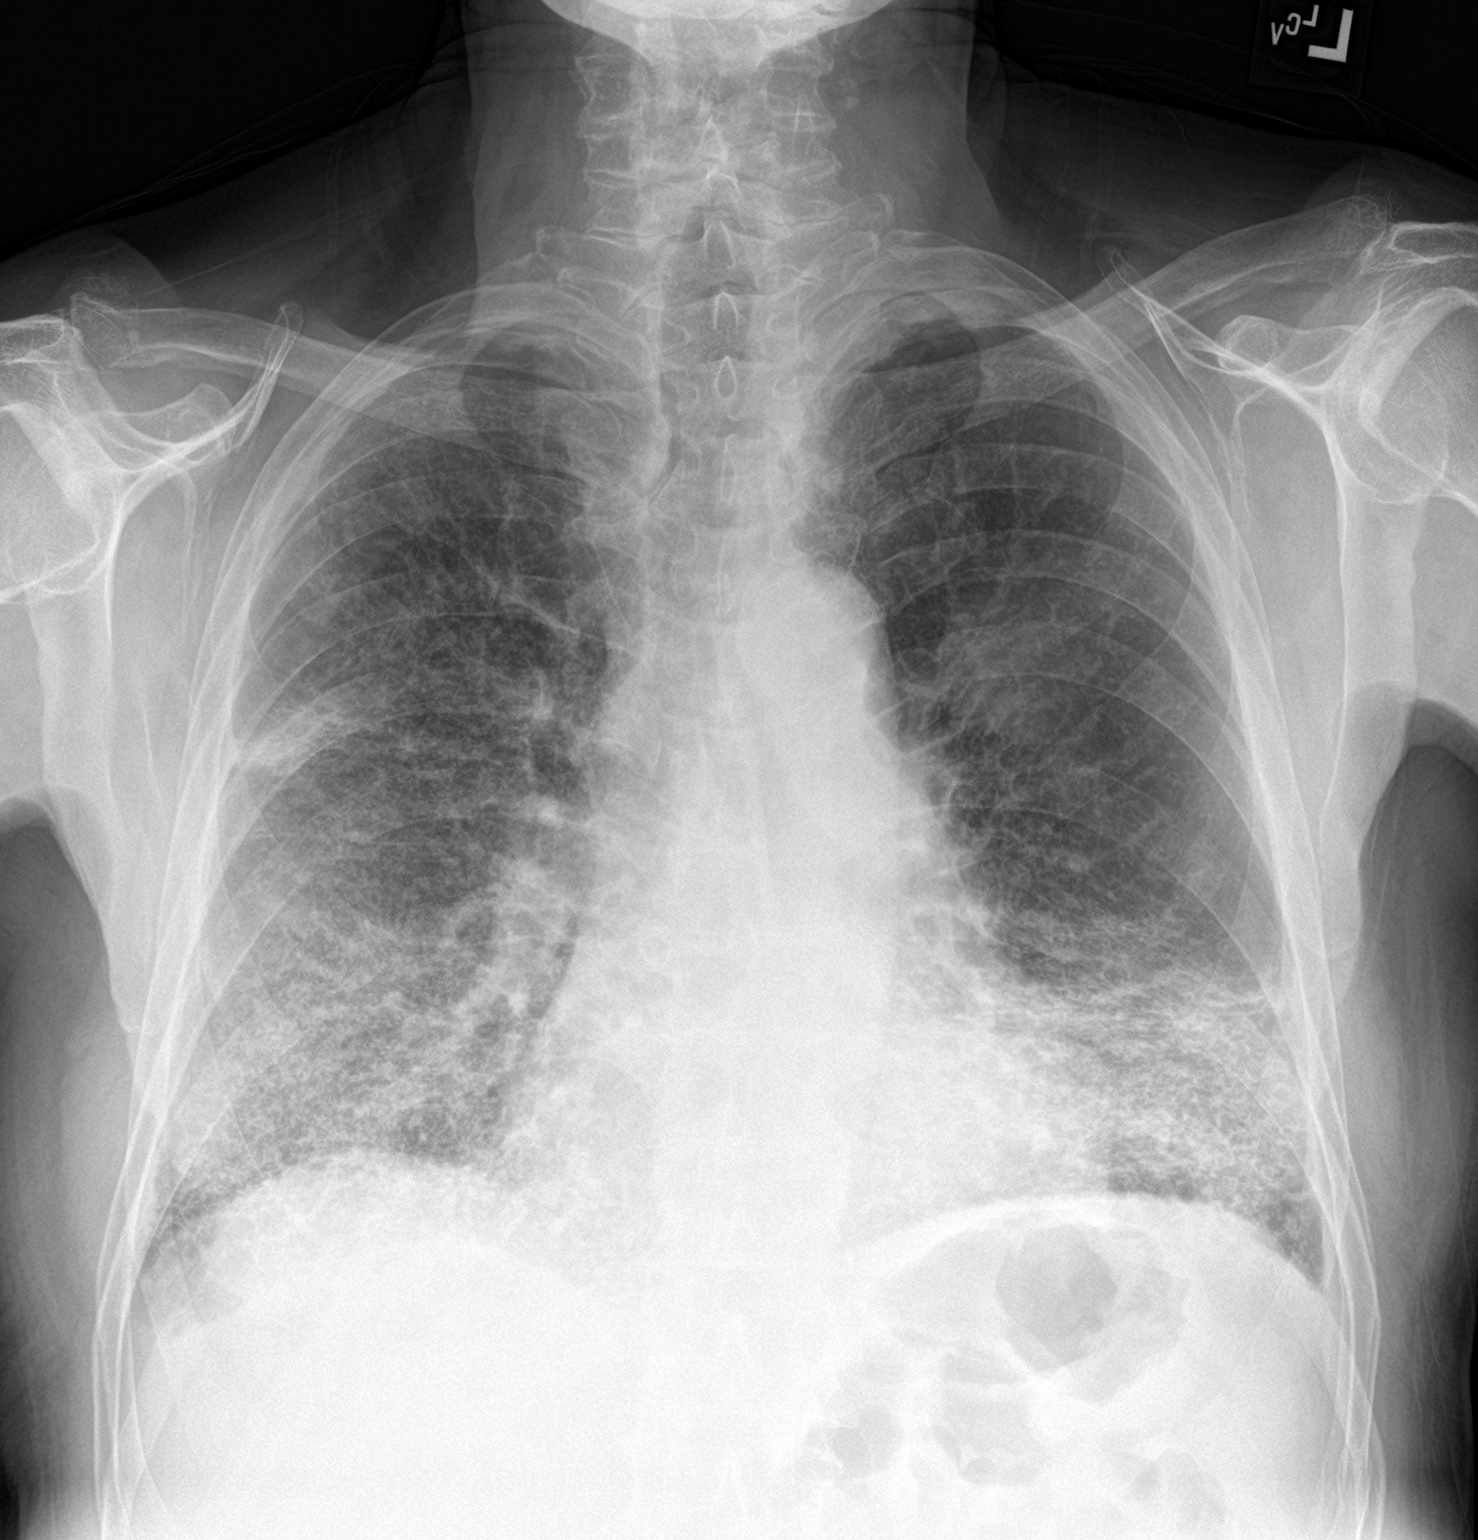

[chest lat]
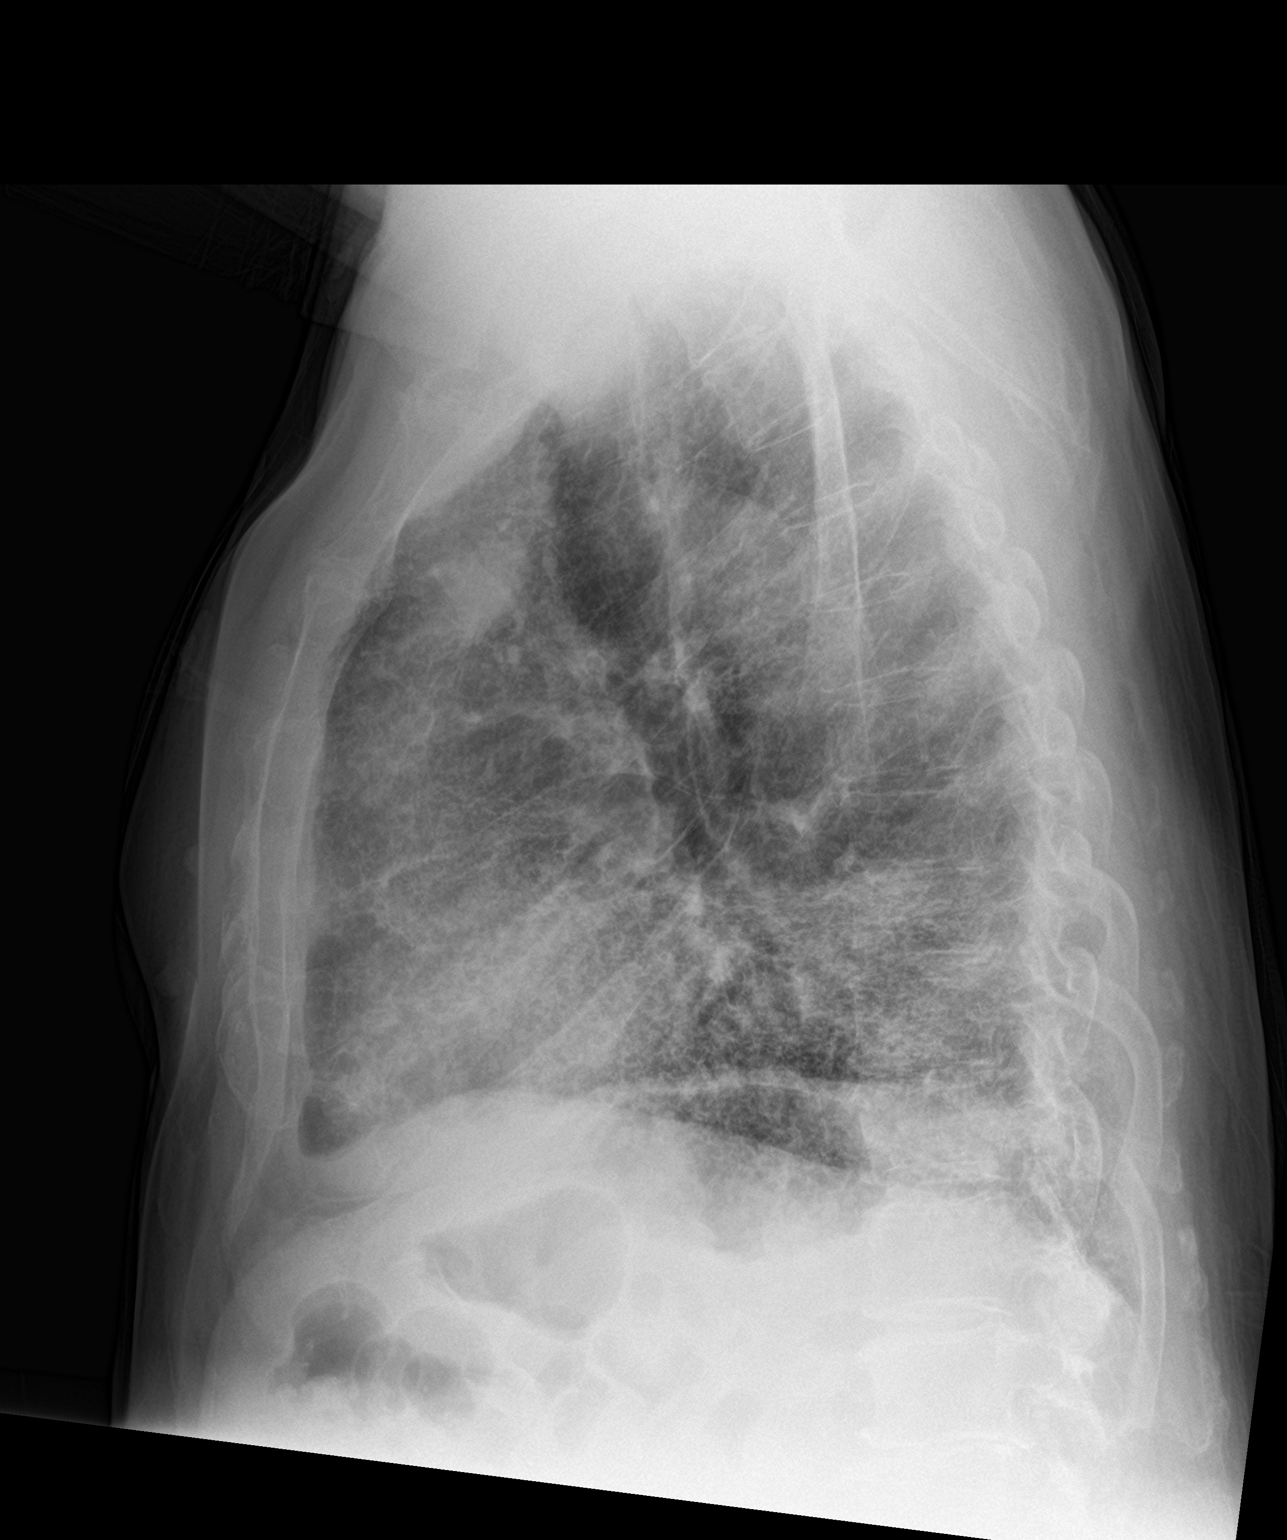

[2 of 2 positions shown; findings below may reference images not displayed]

FINDINGS: Hazy opacity superimposed on extensive interstitial fibrosis is
similar to the prior study, most evident in both lower lobes, right
middle lobe and left upper lobe lingula.

Ill-defined mass in the anterior left upper lobe is stable.

No new lung abnormalities.  No pleural effusion.  No pneumothorax.

Cardiac silhouette is normal in size. No convincing mediastinal or
hilar masses.

Skeletal structures are unremarkable.
IMPRESSION: 1. No convincing change from the most recent chest radiograph.
2. Hazy airspace opacity consistent with pneumonia or pneumonitis is
superimposed on extensive interstitial fibrosis, most evident in the
lower lobes, right middle lobe and left upper lobe lingula, seen to
a lesser degree in the right upper lobe.
